# Patient Record
Sex: Male | Born: 1970 | Race: Black or African American | Hispanic: No | Marital: Single | Smoking: Current every day smoker
Health system: Southern US, Community
[De-identification: ages and names within clinical notes are randomized; demographics above are authoritative.]

## PROBLEM LIST (undated history)

## (undated) DIAGNOSIS — I509 Heart failure, unspecified: Secondary | ICD-10-CM

## (undated) DIAGNOSIS — I82A19 Acute embolism and thrombosis of unspecified axillary vein: Secondary | ICD-10-CM

## (undated) HISTORY — PX: LEG SURGERY: SHX1003

---

## 2006-10-23 ENCOUNTER — Emergency Department: Payer: Self-pay | Admitting: Emergency Medicine

## 2007-03-14 ENCOUNTER — Emergency Department: Payer: Self-pay | Admitting: Emergency Medicine

## 2009-09-06 ENCOUNTER — Emergency Department (HOSPITAL_COMMUNITY): Admission: EM | Admit: 2009-09-06 | Discharge: 2009-09-06 | Payer: Self-pay | Admitting: Emergency Medicine

## 2010-01-05 ENCOUNTER — Emergency Department (HOSPITAL_COMMUNITY): Admission: EM | Admit: 2010-01-05 | Discharge: 2010-01-05 | Payer: Self-pay | Admitting: Emergency Medicine

## 2013-09-08 ENCOUNTER — Emergency Department (HOSPITAL_COMMUNITY): Payer: Self-pay

## 2013-09-08 ENCOUNTER — Encounter (HOSPITAL_COMMUNITY): Payer: Self-pay | Admitting: Emergency Medicine

## 2013-09-08 ENCOUNTER — Emergency Department (HOSPITAL_COMMUNITY)
Admission: EM | Admit: 2013-09-08 | Discharge: 2013-09-08 | Disposition: A | Discharge status: Home / Self Care | Payer: Self-pay | Attending: Emergency Medicine | Admitting: Emergency Medicine

## 2013-09-08 DIAGNOSIS — M25519 Pain in unspecified shoulder: Secondary | ICD-10-CM | POA: Insufficient documentation

## 2013-09-08 DIAGNOSIS — M25512 Pain in left shoulder: Secondary | ICD-10-CM

## 2013-09-08 DIAGNOSIS — K644 Residual hemorrhoidal skin tags: Secondary | ICD-10-CM | POA: Insufficient documentation

## 2013-09-08 DIAGNOSIS — Z79899 Other long term (current) drug therapy: Secondary | ICD-10-CM | POA: Insufficient documentation

## 2013-09-08 DIAGNOSIS — F17200 Nicotine dependence, unspecified, uncomplicated: Secondary | ICD-10-CM | POA: Insufficient documentation

## 2013-09-08 DIAGNOSIS — R200 Anesthesia of skin: Secondary | ICD-10-CM

## 2013-09-08 DIAGNOSIS — Z8719 Personal history of other diseases of the digestive system: Secondary | ICD-10-CM | POA: Insufficient documentation

## 2013-09-08 DIAGNOSIS — M47812 Spondylosis without myelopathy or radiculopathy, cervical region: Secondary | ICD-10-CM

## 2013-09-08 DIAGNOSIS — M1990 Unspecified osteoarthritis, unspecified site: Secondary | ICD-10-CM | POA: Insufficient documentation

## 2013-09-08 DIAGNOSIS — R209 Unspecified disturbances of skin sensation: Secondary | ICD-10-CM | POA: Insufficient documentation

## 2013-09-08 DIAGNOSIS — D696 Thrombocytopenia, unspecified: Secondary | ICD-10-CM | POA: Insufficient documentation

## 2013-09-08 LAB — POCT I-STAT, CHEM 8
BUN: 8 mg/dL (ref 6–23)
Chloride: 103 mEq/L (ref 96–112)
Creatinine, Ser: 0.9 mg/dL (ref 0.50–1.35)
Glucose, Bld: 94 mg/dL (ref 70–99)
HCT: 41 % (ref 39.0–52.0)
Hemoglobin: 13.9 g/dL (ref 13.0–17.0)
Potassium: 4 mEq/L (ref 3.5–5.1)
Sodium: 140 mEq/L (ref 135–145)

## 2013-09-08 LAB — CBC WITH DIFFERENTIAL/PLATELET
Hemoglobin: 13 g/dL (ref 13.0–17.0)
Lymphocytes Relative: 53 % — ABNORMAL HIGH (ref 12–46)
Lymphs Abs: 2.2 10*3/uL (ref 0.7–4.0)
MCHC: 33.8 g/dL (ref 30.0–36.0)
Monocytes Relative: 6 % (ref 3–12)
Neutro Abs: 1.6 10*3/uL — ABNORMAL LOW (ref 1.7–7.7)
Neutrophils Relative %: 39 % — ABNORMAL LOW (ref 43–77)
Platelets: 138 10*3/uL — ABNORMAL LOW (ref 150–400)
RBC: 4.8 MIL/uL (ref 4.22–5.81)
WBC: 4.2 10*3/uL (ref 4.0–10.5)

## 2013-09-08 MED ORDER — HYDROMORPHONE HCL PF 1 MG/ML IJ SOLN
1.0000 mg | Freq: Once | INTRAMUSCULAR | Status: AC
Start: 2013-09-08 — End: 2013-09-08
  Administered 2013-09-08: 1 mg via INTRAMUSCULAR
  Filled 2013-09-08: qty 1

## 2013-09-08 MED ORDER — HYDROCODONE-ACETAMINOPHEN 5-325 MG PO TABS: 1.0000 | ORAL_TABLET | ORAL | Status: DC | PRN

## 2013-09-08 MED ORDER — KETOROLAC TROMETHAMINE 60 MG/2ML IM SOLN
60.0000 mg | Freq: Once | INTRAMUSCULAR | Status: AC
  Administered 2013-09-08: 60 mg via INTRAMUSCULAR
  Filled 2013-09-08: qty 2

## 2013-09-08 MED ORDER — NAPROXEN 500 MG PO TABS: 500.0000 mg | ORAL_TABLET | Freq: Two times a day (BID) | ORAL | Status: DC

## 2013-09-08 NOTE — Progress Notes (Signed)
Orthopedic Tech Progress Note Patient Details:  Micheal Maxwell [birth year] [id number]  Ortho Devices Type of Ortho Device: Velcro wrist splint Ortho Device/Splint Location: LUE Ortho Device/Splint Interventions: Ordered;Application   Jennye Moccasin 09/08/2013, 3:38 PM

## 2013-09-08 NOTE — ED Notes (Signed)
No reported injury in left extremity/left arm/shoulder

## 2013-09-08 NOTE — ED Provider Notes (Signed)
CSN: [id number]     Arrival date & time 09/08/13  1047 History   First MD Initiated Contact with Patient 09/08/13 1245     Chief Complaint  Patient presents with  . Arm Pain  . 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 Patient denies any recent trauma or injury. Patient describes pain as aching/burning pain with radiation down arm, and is worse with movement and when he lays on the affected shoulder. Pain improved with tylenol/aspirin products and aleve. Denies weakness, chest pain, dyspnea, and jaw pain.   Patient also mentions 1 yr hx of intermittent, painless, bright red blood per rectum. Admits to hx of hemorrhoids. Denies abdominal pain, diarrhea, and constipation. Denies back pain or any urinary sxs.    History reviewed. No pertinent past medical history. History reviewed. No pertinent past surgical history. History reviewed. No pertinent family history. History  Substance Use Topics  . Smoking status: Current Every Day Smoker  . Smokeless tobacco: Not on file  . Alcohol Use: No    Review of Systems  Constitutional: Negative for fever and chills.  Genitourinary: Negative for dysuria, hematuria, discharge, difficulty urinating, penile pain and testicular pain.  Musculoskeletal: Negative for gait problem, joint swelling, myalgias, neck pain and neck stiffness.  Neurological: Negative for dizziness, weakness and headaches.    Allergies  Review of patient's allergies indicates no known allergies.  Home Medications   Current Outpatient Rx  Name  Route  Sig  Dispense  Refill  . Aspirin-Caffeine (BAYER BACK & BODY PAIN EX ST) 500-32.5 MG TABS   Oral   Take 2 tablets by mouth every 4 (four) hours as needed (for pain).         . Aspirin-Salicylamide-Caffeine (BC HEADACHE POWDER PO)   Oral   Take  1 Package by mouth daily as needed (for pain).         Marland Kitchen ibuprofen (ADVIL,MOTRIN) 200 MG tablet   Oral   Take 800 mg by mouth every 6 (six) hours as needed for moderate pain.         Marland Kitchen HYDROcodone-acetaminophen (NORCO/VICODIN) 5-325 MG per tablet   Oral   Take 1 tablet by mouth every 4 (four) hours as needed.   15 tablet   0   . naproxen (NAPROSYN) 500 MG tablet   Oral   Take 1 tablet (500 mg total) by mouth 2 (two) times daily.   30 tablet   0    BP 117/87  Pulse 59  Temp(Src) 97.8 F (36.6 C) (Oral)  Resp 18  SpO2 93% Physical Exam  Nursing note and vitals reviewed. Constitutional: He is oriented to person, place, and time. He appears well-developed and well-nourished. No distress.  HENT:  Head: Normocephalic and atraumatic.  Neck: Normal range of motion. Neck supple.  Cardiovascular: Normal rate, regular rhythm, normal heart sounds and intact distal pulses.  Exam reveals no gallop and no friction rub.   No murmur heard. Pulses:      Radial pulses are 2+ on the right side, and 2+ on the left side.       Dorsalis pedis pulses are 2+ on the right side, and 2+ on the left side.  Pulmonary/Chest: Effort normal and breath sounds normal. No respiratory distress. He has no wheezes. He has no rales.  Abdominal: Soft. Bowel sounds are normal. There is no tenderness.  Genitourinary: Penis normal. Rectal  exam shows external hemorrhoid (Old external hemorrhoid. No signs of thrombosis.). Rectal exam shows no fissure, no mass, no tenderness and anal tone normal. Guaiac negative stool.    Left testis shows no mass, no swelling and no tenderness. Circumcised. No penile erythema or penile tenderness. No discharge found.  Musculoskeletal: Normal range of motion. He exhibits no edema.       Left shoulder: He exhibits pain (pain with ROM). He exhibits normal range of motion, no bony tenderness, no swelling, no effusion, no crepitus, no deformity and normal strength.       Left wrist:  Normal.       Arms:      Left hand: He exhibits normal range of motion, no tenderness, normal capillary refill and no swelling. Decreased sensation noted. Decreased sensation is present in the medial distribution. Decreased strength noted.  No thenar atrophy of LEFT hand.   Neurological: He is alert and oriented to person, place, and time. He has normal strength. He displays no atrophy.  Reflex Scores:      Bicep reflexes are 2+ on the right side and 2+ on the left side. Skin: Skin is warm and dry. He is not diaphoretic.  Psychiatric: He has a normal mood and affect. His behavior is normal.    ED Course  Procedures (including critical care time) Labs Review Labs Reviewed  CBC WITH DIFFERENTIAL - Abnormal; Notable for the following:    HCT 38.5 (*)    RDW 15.9 (*)    Platelets 138 (*)    Neutrophils Relative % 39 (*)    Neutro Abs 1.6 (*)    Lymphocytes Relative 53 (*)    All other components within normal limits  POCT I-STAT, CHEM 8 - Abnormal; Notable for the following:    Calcium, Ion 1.32 (*)    All other components within normal limits  OCCULT BLOOD X 1 CARD TO LAB, STOOL  OCCULT BLOOD, POC DEVICE   Imaging Review Dg Cervical Spine Complete  09/08/2013   CLINICAL DATA:  Neck pain  EXAM: CERVICAL SPINE  4+ VIEWS  COMPARISON:  None.  FINDINGS: There is no evidence of cervical spine fracture or prevertebral soft tissue swelling. Alignment is normal. Disc space narrowing and hypertrophic spurring is identified at the C5-6 and C6-7 levels.  IMPRESSION: No evidence of acute osseous abnormalities. Degenerative disc disease changes at C5-6 and C6-7.   Electronically Signed   By: Salome Holmes M.D.   On: 09/08/2013 15:31   Dg Shoulder Left  09/08/2013   CLINICAL DATA:  Left shoulder pain  EXAM: LEFT SHOULDER - 2+ VIEW  COMPARISON:  None.  FINDINGS: There is no evidence of fracture or dislocation. There is no evidence of arthropathy or other focal bone abnormality. Soft tissues are  unremarkable.  IMPRESSION: Negative.   Electronically Signed   By: Ruel Favors M.D.   On: 09/08/2013 15:31    EKG Interpretation    Date/Time:  Tuesday September 08 2013 10:50:19 EST Ventricular Rate:  81 PR Interval:  158 QRS Duration: 84 QT Interval:  340 QTC Calculation: 394 R Axis:   39 Text Interpretation:  Normal sinus rhythm Minimal voltage criteria for LVH, may be normal variant Early repolarization Borderline ECG No old tracing to compare Confirmed by GOLDSTON  MD, SCOTT ([id number]) on 09/08/2013 1:23:07 PM            MDM   1. Shoulder pain, left   2. Numbness and tingling in left hand  3. History of rectal bleeding   4. Cervical spine degeneration     Mild incidental thrombocytopenia. Hgb WNL. Stool guiac negative. Cervical plain film shows disc space narrowing with hypertrophic spurring at C5-C6 and C6-C7, consistent with Degenerative disc disease changes. No acute abnormalities noted. Plain film LEFT shoulder negative.  EKG unchanged compared to prior study.   Discussed lab and exam findings with patient. Patient advised to follow up with PCP in 2 days. Patient given resource guide for follow up in addition to [facility] clinic follow up information. Patient's pain improved with treatment in ED. Educated patient on discharge medications, advised avoidance of driving and tylenol containing products with Vicodin. Patient agrees with plan. Discharged in  Good condition.   Meds given in ED:  Medications  ketorolac (TORADOL) injection 60 mg (60 mg Intramuscular Given 09/08/13 1408)  HYDROmorphone (DILAUDID) injection 1 mg (1 mg Intramuscular Given 09/08/13 1409)    Discharge Medication List as of 09/08/2013  3:44 PM    START taking these medications   Details  HYDROcodone-acetaminophen (NORCO/VICODIN) 5-325 MG per tablet Take 1 tablet by mouth every 4 (four) hours as needed., Starting 09/08/2013, Until Discontinued, Print    naproxen (NAPROSYN) 500 MG tablet  Take 1 tablet (500 mg total) by mouth 2 (two) times daily., Starting 09/08/2013, Until Discontinued, Print               Rudene Anda, [place] 09/08/13 1630

## 2013-09-08 NOTE — ED Notes (Signed)
Ortho called and responded to place carpal tunnel splint on left wrist

## 2013-09-08 NOTE — ED Provider Notes (Signed)
Medical screening examination/treatment/procedure(s) were conducted as a shared visit with non-physician practitioner(s) and myself.  I personally evaluated the patient during the encounter.  EKG Interpretation    Date/Time:  Tuesday September 08 2013 10:50:19 EST Ventricular Rate:  81 PR Interval:  158 QRS Duration: 84 QT Interval:  340 QTC Calculation: 394 R Axis:   39 Text Interpretation:  Normal sinus rhythm Minimal voltage criteria for LVH, may be normal variant Early repolarization Borderline ECG No old tracing to compare Confirmed by Paytin Ramakrishnan  MD, Amier Hoyt ([id number]) on 09/08/2013 1:23:07 PM            Patient with signs of median nerve compression. Good strength in both upper extremities. Normal pulses. Atraumatic pain but seems MSK with movement pain. Will treat symptomatically. Benign Xrays. Given his persistent carpal tunnel sx will refer to plastics.   Audree Camel, MD 09/08/13 [phone]

## 2013-09-08 NOTE — ED Notes (Signed)
Pt c/o left shoulder blade pain with radiation to left arm with numbness and tingling x 2 weeks; pt sts some bleeding from hemorrhoids when wiping increasing over last couple of days

## 2016-09-25 ENCOUNTER — Encounter (HOSPITAL_COMMUNITY): Payer: Self-pay | Admitting: Emergency Medicine

## 2016-09-25 ENCOUNTER — Emergency Department (HOSPITAL_COMMUNITY)
Admission: EM | Admit: 2016-09-25 | Discharge: 2016-09-25 | Disposition: A | Discharge status: Home / Self Care | Payer: Self-pay | Attending: Emergency Medicine | Admitting: Emergency Medicine

## 2016-09-25 DIAGNOSIS — J019 Acute sinusitis, unspecified: Secondary | ICD-10-CM | POA: Insufficient documentation

## 2016-09-25 DIAGNOSIS — Z7982 Long term (current) use of aspirin: Secondary | ICD-10-CM | POA: Insufficient documentation

## 2016-09-25 DIAGNOSIS — F17200 Nicotine dependence, unspecified, uncomplicated: Secondary | ICD-10-CM | POA: Insufficient documentation

## 2016-09-25 MED ORDER — PSEUDOEPHEDRINE HCL 30 MG PO TABS
30.0000 mg | ORAL_TABLET | Freq: Once | ORAL | Status: AC
Start: 2016-09-25 — End: 2016-09-25
  Administered 2016-09-25: 30 mg via ORAL
  Filled 2016-09-25: qty 1

## 2016-09-25 MED ORDER — AZITHROMYCIN 250 MG PO TABS
500.0000 mg | ORAL_TABLET | Freq: Once | ORAL | Status: AC
  Administered 2016-09-25: 500 mg via ORAL
  Filled 2016-09-25: qty 2

## 2016-09-25 MED ORDER — PSEUDOEPHEDRINE HCL ER 120 MG PO TB12: 120.0000 mg | ORAL_TABLET | Freq: Two times a day (BID) | ORAL | 0 refills | Status: DC

## 2016-09-25 MED ORDER — AZITHROMYCIN 250 MG PO TABS: 250.0000 mg | ORAL_TABLET | Freq: Every day | ORAL | 0 refills | Status: AC

## 2016-09-25 NOTE — ED Triage Notes (Signed)
Pt sts URI sx with productive cough

## 2016-09-25 NOTE — ED Provider Notes (Signed)
[facility] DEPT Provider Note   CSN: [id number] Arrival date & time: 09/25/16  1505     History   Chief Complaint Chief Complaint  Patient presents with  . URI    HPI Micheal Maxwell is a 46 y.o. male.  HPI Patient presents with concern of ongoing nasal drainage, malodorous is charge from his oropharynx. Patient became ill about 10 days ago, initially with sore throat, chills, fever, cough. After several days of TheraFlu, sore throat is resolved, and he has no ongoing cough, fever. However, he has copious discharge, malodorous, persistent in spite of using additional OTC medication. He was well prior to the onset of symptoms. Patient smokes, states that he is otherwise healthy.  History reviewed. No pertinent past medical history.  There are no active problems to display for this patient.   History reviewed. No pertinent surgical history.     Home Medications    Prior to Admission medications   Medication Sig Start Date End Date Taking? Authorizing Provider  Aspirin-Caffeine (BAYER BACK & BODY PAIN EX ST) 500-32.5 MG TABS Take 2 tablets by mouth every 4 (four) hours as needed (for pain).    Historical Provider, MD  Aspirin-Salicylamide-Caffeine (BC HEADACHE POWDER PO) Take 1 Package by mouth daily as needed (for pain).    Historical Provider, MD  azithromycin (ZITHROMAX) 250 MG tablet Take 1 tablet (250 mg total) by mouth daily. Take 1 every day until finished. 09/26/16 09/30/16  Carmin Muskrat, MD  HYDROcodone-acetaminophen (NORCO/VICODIN) 5-325 MG per tablet Take 1 tablet by mouth every 4 (four) hours as needed. 09/08/13   Maude Leriche, PA-C  ibuprofen (ADVIL,MOTRIN) 200 MG tablet Take 800 mg by mouth every 6 (six) hours as needed for moderate pain.    Historical Provider, MD  naproxen (NAPROSYN) 500 MG tablet Take 1 tablet (500 mg total) by mouth 2 (two) times daily. 09/08/13   Maude Leriche, PA-C  pseudoephedrine (SUDAFED 12 HOUR) 120 MG 12 hr tablet Take 1 tablet  (120 mg total) by mouth 2 (two) times daily. Use twice daily for five days 09/25/16   Carmin Muskrat, MD    Family History History reviewed. No pertinent family history.  Social History Social History  Substance Use Topics  . Smoking status: Current Every Day Smoker  . Smokeless tobacco: Not on file  . Alcohol use No     Allergies   Patient has no known allergies.   Review of Systems Review of Systems  Constitutional:       Per HPI, otherwise negative  HENT:       Per HPI, otherwise negative  Respiratory:       Per HPI, otherwise negative  Cardiovascular:       Per HPI, otherwise negative  Gastrointestinal: Negative for vomiting.  Endocrine:       Negative aside from HPI  Genitourinary:       Neg aside from HPI   Musculoskeletal:       Per HPI, otherwise negative  Skin: Negative.   Neurological: Negative for syncope.     Physical Exam Updated Vital Signs BP 127/88 (BP Location: Right Arm)   Pulse 74   Temp 98.3 F (36.8 C) (Oral)   Resp 18   Ht 5\' 6"  (1.676 m)   Wt 171 lb (77.6 kg)   SpO2 100%   BMI 27.60 kg/m   Physical Exam  Constitutional: He is oriented to person, place, and time. He appears well-developed. No distress.  HENT:  Head: Normocephalic and  atraumatic.  Right Ear: Tympanic membrane normal.  Left Ear: Tympanic membrane normal.  Mouth/Throat: Mucous membranes are not pale. No tonsillar abscesses.  Eyes: Conjunctivae and EOM are normal.  Cardiovascular: Normal rate and regular rhythm.   Pulmonary/Chest: Effort normal. No stridor. No respiratory distress.  Abdominal: He exhibits no distension.  Musculoskeletal: He exhibits no edema.  Neurological: He is alert and oriented to person, place, and time.  Skin: Skin is warm and dry.  Psychiatric: He has a normal mood and affect.  Nursing note and vitals reviewed.    ED Treatments / Results   Radiology No results found.  Procedures Procedures (including critical care  time)  Medications Ordered in ED Medications  azithromycin (ZITHROMAX) tablet 500 mg (not administered)  pseudoephedrine (SUDAFED) tablet 30 mg (not administered)     Initial Impression / Assessment and Plan / ED Course  I have reviewed the triage vital signs and the nursing notes.  Pertinent labs & imaging results that were available during my care of the patient were reviewed by me and considered in my medical decision making (see chart for details).  Clinical Course     Patient presents with almost 10 days of ongoing URI like illness with ongoing persistent malodorous discharge. Given the patient's smoking history, concern for persistent symptoms for greater than one week, there suspicion for acute sinusitis. No evidence for bacteremia, sepsis, or pharyngeal abscess.  Patient started on a course of medication, antibiotics, Sudafed, discharged with outpatient follow-up.  Final Clinical Impressions(s) / ED Diagnoses   Final diagnoses:  Acute non-recurrent sinusitis, unspecified location    New Prescriptions New Prescriptions   AZITHROMYCIN (ZITHROMAX) 250 MG TABLET    Take 1 tablet (250 mg total) by mouth daily. Take 1 every day until finished.   PSEUDOEPHEDRINE (SUDAFED 12 HOUR) 120 MG 12 HR TABLET    Take 1 tablet (120 mg total) by mouth 2 (two) times daily. Use twice daily for five days     Carmin Muskrat, MD 09/25/16 [phone]

## 2017-05-05 ENCOUNTER — Encounter: Payer: Self-pay | Admitting: Emergency Medicine

## 2017-05-05 DIAGNOSIS — F17200 Nicotine dependence, unspecified, uncomplicated: Secondary | ICD-10-CM | POA: Insufficient documentation

## 2017-05-05 DIAGNOSIS — R310 Gross hematuria: Secondary | ICD-10-CM | POA: Insufficient documentation

## 2017-05-05 DIAGNOSIS — Z791 Long term (current) use of non-steroidal anti-inflammatories (NSAID): Secondary | ICD-10-CM | POA: Insufficient documentation

## 2017-05-05 LAB — CBC
HEMATOCRIT: 41.1 % (ref 40.0–52.0)
Hemoglobin: 13.4 g/dL (ref 13.0–18.0)
MCH: 26.3 pg (ref 26.0–34.0)
MCHC: 32.5 g/dL (ref 32.0–36.0)
MCV: 81 fL (ref 80.0–100.0)
Platelets: 220 10*3/uL (ref 150–440)
RBC: 5.08 MIL/uL (ref 4.40–5.90)
RDW: 16.6 % — ABNORMAL HIGH (ref 11.5–14.5)
WBC: 6.4 10*3/uL (ref 3.8–10.6)

## 2017-05-05 LAB — URINALYSIS, COMPLETE (UACMP) WITH MICROSCOPIC
BACTERIA UA: NONE SEEN
Bilirubin Urine: NEGATIVE
GLUCOSE, UA: NEGATIVE mg/dL
KETONES UR: NEGATIVE mg/dL
Leukocytes, UA: NEGATIVE
Nitrite: NEGATIVE
Protein, ur: NEGATIVE mg/dL
Specific Gravity, Urine: 1.028 (ref 1.005–1.030)
pH: 5 (ref 5.0–8.0)

## 2017-05-05 LAB — COMPREHENSIVE METABOLIC PANEL
ALBUMIN: 4 g/dL (ref 3.5–5.0)
ALT: 19 U/L (ref 17–63)
AST: 22 U/L (ref 15–41)
Alkaline Phosphatase: 77 U/L (ref 38–126)
Anion gap: 6 (ref 5–15)
BUN: 13 mg/dL (ref 6–20)
CHLORIDE: 106 mmol/L (ref 101–111)
CO2: 28 mmol/L (ref 22–32)
Calcium: 9.4 mg/dL (ref 8.9–10.3)
Creatinine, Ser: 1.29 mg/dL — ABNORMAL HIGH (ref 0.61–1.24)
GFR calc Af Amer: >60 mL/min (ref >60)
GFR calc non Af Amer: >60 mL/min (ref >60)
GLUCOSE: 89 mg/dL (ref 65–99)
POTASSIUM: 4.6 mmol/L (ref 3.5–5.1)
Sodium: 140 mmol/L (ref 135–145)
Total Bilirubin: 0.6 mg/dL (ref 0.3–1.2)
Total Protein: 6.7 g/dL (ref 6.5–8.1)

## 2017-05-05 NOTE — ED Triage Notes (Signed)
Patient with complaint of bright red blood and clots in his urine times three days. Patient states that he has also had some bleeding with his stool for over a year. Patient states that he has a history of hemorrhoids.

## 2017-05-06 ENCOUNTER — Encounter: Payer: Self-pay | Admitting: Radiology

## 2017-05-06 ENCOUNTER — Emergency Department: Payer: Self-pay

## 2017-05-06 ENCOUNTER — Emergency Department
Admission: EM | Admit: 2017-05-06 | Discharge: 2017-05-06 | Disposition: A | Discharge status: Home / Self Care | Payer: Self-pay | Attending: Emergency Medicine | Admitting: Emergency Medicine

## 2017-05-06 DIAGNOSIS — R310 Gross hematuria: Secondary | ICD-10-CM

## 2017-05-06 LAB — CHLAMYDIA/NGC RT PCR (ARMC ONLY)
Chlamydia Tr: NOT DETECTED
N gonorrhoeae: NOT DETECTED

## 2017-05-06 MED ORDER — IOPAMIDOL (ISOVUE-300) INJECTION 61%
100.0000 mL | Freq: Once | INTRAVENOUS | Status: AC | PRN
  Administered 2017-05-06: 100 mL via INTRAVENOUS

## 2017-05-06 NOTE — ED Notes (Signed)
Patient verbalizes understanding of discharge instructions home care and follow up care. Patient out of department at this time.
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
## 2017-05-13 ENCOUNTER — Ambulatory Visit: Payer: Self-pay
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
## 2017-05-16 ENCOUNTER — Ambulatory Visit (INDEPENDENT_AMBULATORY_CARE_PROVIDER_SITE_OTHER): Payer: Self-pay | Admitting: Urology

## 2017-05-16 ENCOUNTER — Encounter: Payer: Self-pay | Admitting: Urology

## 2017-05-16 VITALS — BP 130/88 | HR 65 | Ht 66.0 in | Wt 166.2 lb

## 2017-05-16 DIAGNOSIS — R310 Gross hematuria: Secondary | ICD-10-CM

## 2017-05-16 DIAGNOSIS — N400 Enlarged prostate without lower urinary tract symptoms: Secondary | ICD-10-CM

## 2017-05-16 LAB — MICROSCOPIC EXAMINATION
Bacteria, UA: NONE SEEN
EPITHELIAL CELLS (NON RENAL): NONE SEEN /HPF (ref 0–10)
WBC, UA: NONE SEEN /hpf (ref 0–?)

## 2017-05-16 LAB — URINALYSIS, COMPLETE
Bilirubin, UA: NEGATIVE
GLUCOSE, UA: NEGATIVE
KETONES UA: NEGATIVE
LEUKOCYTES UA: NEGATIVE
NITRITE UA: NEGATIVE
PROTEIN UA: NEGATIVE
RBC, UA: NEGATIVE
Urobilinogen, Ur: 1 mg/dL (ref 0.2–1.0)
pH, UA: 6 (ref 5.0–7.5)

## 2017-05-17 ENCOUNTER — Telehealth: Payer: Self-pay | Admitting: Family Medicine

## 2017-05-17 LAB — BASIC METABOLIC PANEL
BUN/Creatinine Ratio: 16 (ref 9–20)
BUN: 15 mg/dL (ref 6–24)
CHLORIDE: 104 mmol/L (ref 96–106)
CO2: 21 mmol/L (ref 20–29)
Calcium: 9.7 mg/dL (ref 8.7–10.2)
Creatinine, Ser: 0.94 mg/dL (ref 0.76–1.27)
GFR calc Af Amer: 110 mL/min/{1.73_m2} (ref >59)
GFR, EST NON AFRICAN AMERICAN: 95 mL/min/{1.73_m2} (ref >59)
Glucose: 91 mg/dL (ref 65–99)
POTASSIUM: 4.2 mmol/L (ref 3.5–5.2)
SODIUM: 141 mmol/L (ref 134–144)

## 2017-05-17 LAB — CBC WITH DIFFERENTIAL/PLATELET
BASOS ABS: 0 10*3/uL (ref 0.0–0.2)
Basos: 1 %
EOS (ABSOLUTE): 0.2 10*3/uL (ref 0.0–0.4)
Eos: 5 %
HEMOGLOBIN: 12.4 g/dL — AB (ref 13.0–17.7)
Hematocrit: 37.9 % (ref 37.5–51.0)
IMMATURE GRANS (ABS): 0 10*3/uL (ref 0.0–0.1)
Immature Granulocytes: 0 %
LYMPHS: 52 %
Lymphocytes Absolute: 2.4 10*3/uL (ref 0.7–3.1)
MCH: 26.2 pg — ABNORMAL LOW (ref 26.6–33.0)
MCHC: 32.7 g/dL (ref 31.5–35.7)
MCV: 80 fL (ref 79–97)
MONOCYTES: 7 %
Monocytes Absolute: 0.3 10*3/uL (ref 0.1–0.9)
Neutrophils Absolute: 1.6 10*3/uL (ref 1.4–7.0)
Neutrophils: 35 %
Platelets: 225 10*3/uL (ref 150–379)
RBC: 4.73 x10E6/uL (ref 4.14–5.80)
RDW: 16.5 % — ABNORMAL HIGH (ref 12.3–15.4)
WBC: 4.5 10*3/uL (ref 3.4–10.8)

## 2017-05-17 LAB — PSA: Prostate Specific Ag, Serum: 1.8 ng/mL (ref 0.0–4.0)

## 2017-05-17 NOTE — Telephone Encounter (Signed)
-----   Message from Nori Riis, PA-C sent at 05/17/2017  7:37 AM EDT ----- Please let Glendell Docker know that his PSA is normal, but his HBG is a little low.  I would suggest eating a bowl of fortified cereal daily, like Total, and wait on the results of the CT and cystoscopy before taking further action.

## 2017-05-17 NOTE — Telephone Encounter (Signed)
Patient notified and voiced understanding.

## 2017-05-21 LAB — CULTURE, URINE COMPREHENSIVE

## 2017-05-30 ENCOUNTER — Ambulatory Visit
Admission: RE | Admit: 2017-05-30 | Discharge: 2017-05-30 | Disposition: A | Discharge status: Home / Self Care | Payer: Self-pay | Source: Ambulatory Visit | Attending: Urology | Admitting: Urology

## 2017-05-30 DIAGNOSIS — I700 Atherosclerosis of aorta: Secondary | ICD-10-CM | POA: Insufficient documentation

## 2017-05-30 DIAGNOSIS — D1803 Hemangioma of intra-abdominal structures: Secondary | ICD-10-CM | POA: Insufficient documentation

## 2017-05-30 DIAGNOSIS — R310 Gross hematuria: Secondary | ICD-10-CM | POA: Insufficient documentation

## 2017-05-30 DIAGNOSIS — Q630 Accessory kidney: Secondary | ICD-10-CM | POA: Insufficient documentation

## 2017-05-30 DIAGNOSIS — K8020 Calculus of gallbladder without cholecystitis without obstruction: Secondary | ICD-10-CM | POA: Insufficient documentation

## 2017-05-30 MED ORDER — IOPAMIDOL (ISOVUE-300) INJECTION 61%
125.0000 mL | Freq: Once | INTRAVENOUS | Status: AC | PRN
  Administered 2017-05-30: 125 mL via INTRAVENOUS

## 2017-05-31 ENCOUNTER — Telehealth: Payer: Self-pay

## 2017-05-31 ENCOUNTER — Other Ambulatory Visit: Payer: Self-pay | Admitting: Urology

## 2017-05-31 NOTE — Telephone Encounter (Signed)
The CT scan is the first part of the work up for blood in the urine.  Results are really not final until the cystoscopy is completed.  We can try to move his cystoscopy up and he can open his My Chart account and see the CT scan report.

## 2017-05-31 NOTE — Telephone Encounter (Signed)
Spoke with patient and gave him message. Patient would like to move the cytoscopy up. Angie booked him with Dr. Junious Silk on September 24th at 3:15pm. Patient ok with plan.

## 2017-05-31 NOTE — Telephone Encounter (Signed)
Pt called requesting CT results. Please advise.  Pt does have a cysto appt 10/5 with Dr. Erlene Quan.

## 2017-06-10 ENCOUNTER — Other Ambulatory Visit: Payer: Self-pay | Admitting: Urology

## 2017-06-12 ENCOUNTER — Other Ambulatory Visit: Payer: Self-pay | Admitting: Urology

## 2017-06-18 ENCOUNTER — Ambulatory Visit (INDEPENDENT_AMBULATORY_CARE_PROVIDER_SITE_OTHER): Payer: Self-pay | Admitting: Urology

## 2017-06-18 ENCOUNTER — Encounter: Payer: Self-pay | Admitting: Urology

## 2017-06-18 VITALS — BP 123/82 | HR 61 | Ht 66.0 in | Wt 173.0 lb

## 2017-06-18 DIAGNOSIS — R310 Gross hematuria: Secondary | ICD-10-CM

## 2017-06-18 MED ORDER — LIDOCAINE HCL 2 % EX GEL
1.0000 "application " | Freq: Once | CUTANEOUS | Status: AC
  Administered 2017-06-18: 1 via URETHRAL

## 2017-06-18 MED ORDER — CIPROFLOXACIN HCL 500 MG PO TABS
500.0000 mg | ORAL_TABLET | Freq: Once | ORAL | Status: AC
  Administered 2017-06-18: 500 mg via ORAL

## 2017-06-18 NOTE — Progress Notes (Signed)
   06/18/17  CC:  Chief Complaint  Patient presents with  . Cysto    HPI:  Micheal Maxwell follows up for a gross hematuria evaluation. He underwent a CT scan of the abdomen and pelvis 05/30/2017 which was benign. 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 I reviewed all the images. He had moderate prostate enlargement on the CT and recent PSA of 1.8. He also complained about erectile dysfunction and he'll follow up to discuss this further.  There were no vitals taken for this visit. NED. A&Ox3.   No respiratory distress   Abd soft, NT, ND Normal phallus with bilateral descended testicles  Cystoscopy Procedure Note  Patient identification was confirmed, informed consent was obtained, and patient was prepped using Betadine solution.  Lidocaine jelly was administered per urethral meatus.    Preoperative abx where received prior to procedure.     Pre-Procedure: - Inspection reveals a normal caliber ureteral meatus.  Procedure: The flexible cystoscope was introduced without difficulty - No urethral strictures/lesions are present. - prostate slight elongated  - elevated bladder neck - Bilateral ureteral orifices identified - Bladder mucosa  reveals no ulcers, tumors, or lesions - No bladder stones - No trabeculation  Retroflexion shows normal bladder neck    Post-Procedure: - Patient tolerated the procedure well  Assessment/ Plan:  Gross hematuria-benign evaluation. We did discussed the link between smoking and GU malignancies and I encouraged him to stop.  Erectile dysfunction-he'll follow-up to discuss.

## 2017-06-21 ENCOUNTER — Other Ambulatory Visit: Payer: Self-pay | Admitting: Urology
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
## 2017-07-09 ENCOUNTER — Ambulatory Visit: Payer: Self-pay | Admitting: Urology
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
## 2017-07-11 ENCOUNTER — Ambulatory Visit (INDEPENDENT_AMBULATORY_CARE_PROVIDER_SITE_OTHER): Payer: Self-pay | Admitting: Urology

## 2017-07-11 ENCOUNTER — Encounter: Payer: Self-pay | Admitting: Urology

## 2017-07-11 VITALS — BP 143/94 | HR 79 | Ht 66.0 in | Wt 176.0 lb

## 2017-07-11 DIAGNOSIS — N528 Other male erectile dysfunction: Secondary | ICD-10-CM

## 2017-07-11 DIAGNOSIS — N401 Enlarged prostate with lower urinary tract symptoms: Secondary | ICD-10-CM

## 2017-07-11 DIAGNOSIS — N138 Other obstructive and reflux uropathy: Secondary | ICD-10-CM

## 2017-07-11 DIAGNOSIS — Z87448 Personal history of other diseases of urinary system: Secondary | ICD-10-CM

## 2017-07-11 MED ORDER — SILDENAFIL CITRATE 20 MG PO TABS: ORAL_TABLET | ORAL | 3 refills | Status: DC

## 2017-07-14 LAB — TSH: TSH: 1.39 u[IU]/mL (ref 0.450–4.500)

## 2017-07-14 LAB — TESTOSTERONE: TESTOSTERONE: 399 ng/dL (ref 264–916)

## 2017-07-15 ENCOUNTER — Telehealth: Payer: Self-pay

## 2017-07-15 NOTE — Telephone Encounter (Signed)
-----   Message from Nori Riis, PA-C sent at 07/15/2017 11:26 AM EDT ----- Please let Cark know that his testosterone level is normal and that his thyroid is normal as well.   We will see him in one month.

## 2017-07-15 NOTE — Telephone Encounter (Signed)
Spoke with pt in reference to lab results. Pt voiced understanding.  
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
## 2017-08-12 ENCOUNTER — Ambulatory Visit: Payer: Self-pay | Admitting: Urology

## 2017-08-12 ENCOUNTER — Encounter: Payer: Self-pay | Admitting: Urology
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
## 2017-08-19 ENCOUNTER — Encounter: Payer: Self-pay | Admitting: Urology

## 2017-08-19 ENCOUNTER — Ambulatory Visit: Payer: Self-pay | Admitting: Urology

## 2017-12-24 ENCOUNTER — Encounter: Payer: Self-pay | Admitting: Emergency Medicine

## 2017-12-24 ENCOUNTER — Other Ambulatory Visit: Payer: Self-pay

## 2017-12-24 ENCOUNTER — Emergency Department: Payer: Self-pay

## 2017-12-24 ENCOUNTER — Emergency Department
Admission: EM | Admit: 2017-12-24 | Discharge: 2017-12-24 | Disposition: A | Discharge status: Home / Self Care | Payer: Self-pay | Attending: Student in an Organized Health Care Education/Training Program | Admitting: Student in an Organized Health Care Education/Training Program

## 2017-12-24 DIAGNOSIS — R197 Diarrhea, unspecified: Secondary | ICD-10-CM | POA: Insufficient documentation

## 2017-12-24 DIAGNOSIS — F17200 Nicotine dependence, unspecified, uncomplicated: Secondary | ICD-10-CM | POA: Insufficient documentation

## 2017-12-24 DIAGNOSIS — R112 Nausea with vomiting, unspecified: Secondary | ICD-10-CM | POA: Insufficient documentation

## 2017-12-24 LAB — URINALYSIS, COMPLETE (UACMP) WITH MICROSCOPIC
Bacteria, UA: NONE SEEN
Bilirubin Urine: NEGATIVE
Glucose, UA: NEGATIVE mg/dL
HGB URINE DIPSTICK: NEGATIVE
KETONES UR: NEGATIVE mg/dL
LEUKOCYTES UA: NEGATIVE
Nitrite: NEGATIVE
PH: 5 (ref 5.0–8.0)
Protein, ur: NEGATIVE mg/dL
Specific Gravity, Urine: 1.028 (ref 1.005–1.030)

## 2017-12-24 LAB — COMPREHENSIVE METABOLIC PANEL
ALK PHOS: 68 U/L (ref 38–126)
ALT: 17 U/L (ref 17–63)
ANION GAP: 2 — AB (ref 5–15)
AST: 17 U/L (ref 15–41)
Albumin: 4 g/dL (ref 3.5–5.0)
BUN: 12 mg/dL (ref 6–20)
CALCIUM: 8.7 mg/dL — AB (ref 8.9–10.3)
CO2: 26 mmol/L (ref 22–32)
CREATININE: 1 mg/dL (ref 0.61–1.24)
Chloride: 109 mmol/L (ref 101–111)
Glucose, Bld: 103 mg/dL — ABNORMAL HIGH (ref 65–99)
Potassium: 4.1 mmol/L (ref 3.5–5.1)
Sodium: 137 mmol/L (ref 135–145)
TOTAL PROTEIN: 6.8 g/dL (ref 6.5–8.1)
Total Bilirubin: 0.5 mg/dL (ref 0.3–1.2)

## 2017-12-24 LAB — CBC
HCT: 39.7 % — ABNORMAL LOW (ref 40.0–52.0)
HEMOGLOBIN: 12.8 g/dL — AB (ref 13.0–18.0)
MCH: 25.6 pg — AB (ref 26.0–34.0)
MCHC: 32.2 g/dL (ref 32.0–36.0)
MCV: 79.6 fL — ABNORMAL LOW (ref 80.0–100.0)
PLATELETS: 199 10*3/uL (ref 150–440)
RBC: 4.99 MIL/uL (ref 4.40–5.90)
RDW: 17.2 % — ABNORMAL HIGH (ref 11.5–14.5)
WBC: 3.9 10*3/uL (ref 3.8–10.6)

## 2017-12-24 LAB — LIPASE, BLOOD: Lipase: 38 U/L (ref 11–51)

## 2017-12-24 MED ORDER — PROMETHAZINE HCL 12.5 MG PO TABS: 12.5000 mg | ORAL_TABLET | Freq: Four times a day (QID) | ORAL | 0 refills | Status: DC | PRN

## 2017-12-24 MED ORDER — AZITHROMYCIN 500 MG PO TABS: 500.0000 mg | ORAL_TABLET | Freq: Every day | ORAL | 0 refills | Status: AC

## 2017-12-24 NOTE — ED Triage Notes (Signed)
Pt to ED via POV with c/o nausea, vomiting, and diarrhea x4days. Pt denies fever. VSS
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
## 2017-12-24 NOTE — Discharge Instructions (Addendum)

## 2018-11-04 DIAGNOSIS — Z720 Tobacco use: Secondary | ICD-10-CM | POA: Insufficient documentation

## 2018-12-03 DIAGNOSIS — R7303 Prediabetes: Secondary | ICD-10-CM | POA: Insufficient documentation

## 2018-12-03 DIAGNOSIS — E538 Deficiency of other specified B group vitamins: Secondary | ICD-10-CM | POA: Insufficient documentation

## 2019-03-19 DIAGNOSIS — F529 Unspecified sexual dysfunction not due to a substance or known physiological condition: Secondary | ICD-10-CM | POA: Insufficient documentation

## 2019-03-19 DIAGNOSIS — R1013 Epigastric pain: Secondary | ICD-10-CM | POA: Insufficient documentation

## 2019-03-19 DIAGNOSIS — R0789 Other chest pain: Secondary | ICD-10-CM | POA: Insufficient documentation

## 2019-03-19 DIAGNOSIS — K5909 Other constipation: Secondary | ICD-10-CM | POA: Insufficient documentation

## 2019-07-23 ENCOUNTER — Encounter: Payer: Self-pay | Admitting: Emergency Medicine

## 2019-07-23 ENCOUNTER — Ambulatory Visit
Admission: EM | Admit: 2019-07-23 | Discharge: 2019-07-23 | Disposition: A | Discharge status: Home / Self Care | Payer: PRIVATE HEALTH INSURANCE | Attending: Urgent Care | Admitting: Urgent Care

## 2019-07-23 ENCOUNTER — Other Ambulatory Visit: Payer: Self-pay

## 2019-07-23 DIAGNOSIS — X500XXA Overexertion from strenuous movement or load, initial encounter: Secondary | ICD-10-CM | POA: Diagnosis not present

## 2019-07-23 DIAGNOSIS — M62830 Muscle spasm of back: Secondary | ICD-10-CM | POA: Diagnosis not present

## 2019-07-23 DIAGNOSIS — T148XXA Other injury of unspecified body region, initial encounter: Secondary | ICD-10-CM

## 2019-07-23 MED ORDER — METHOCARBAMOL 500 MG PO TABS: 500.0000 mg | ORAL_TABLET | Freq: Two times a day (BID) | ORAL | 0 refills | Status: DC

## 2019-07-23 MED ORDER — NAPROXEN 500 MG PO TABS: 500.0000 mg | ORAL_TABLET | Freq: Two times a day (BID) | ORAL | 0 refills | Status: DC

## 2019-07-23 NOTE — ED Triage Notes (Signed)
Patient states he woke up this morning with his mid back hurting. He went to work and was unable to perform his job at work. He states it hurts when he takes a deep breath.

## 2019-07-23 NOTE — Discharge Instructions (Signed)
It was very nice seeing you today in clinic. Thank you for entrusting me with your care.   Rest and avoid lifting for the next few days. Try low intensity stretches as discussed. Use anti-inflammatory medication and muscle relaxers as needed. Apply moist heat 3-4 times a day for at least 15-20 minutes at a time.  Make arrangements to follow up with your regular doctor in 1 week for re-evaluation if not improving. If your symptoms/condition worsens, please seek follow up care either here or in the ER. Please remember, our [facility] providers are "right here with you" when you need [place].   Again, it was my pleasure to take care of you today. Thank you for choosing our clinic. I hope that you start to feel better quickly.   Honor Loh, MSN, APRN, FNP-C, CEN Advanced Practice Provider [facility]
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
## 2019-09-07 DIAGNOSIS — S62002K Unspecified fracture of navicular [scaphoid] bone of left wrist, subsequent encounter for fracture with nonunion: Secondary | ICD-10-CM | POA: Insufficient documentation

## 2019-10-20 DIAGNOSIS — Z4889 Encounter for other specified surgical aftercare: Secondary | ICD-10-CM | POA: Insufficient documentation

## 2019-10-20 DIAGNOSIS — G5602 Carpal tunnel syndrome, left upper limb: Secondary | ICD-10-CM | POA: Insufficient documentation

## 2019-12-04 DIAGNOSIS — N529 Male erectile dysfunction, unspecified: Secondary | ICD-10-CM | POA: Insufficient documentation

## 2019-12-07 ENCOUNTER — Other Ambulatory Visit: Payer: Self-pay | Admitting: Family Medicine

## 2019-12-07 DIAGNOSIS — N5089 Other specified disorders of the male genital organs: Secondary | ICD-10-CM

## 2019-12-11 ENCOUNTER — Ambulatory Visit: Admission: RE | Admit: 2019-12-11 | Payer: PRIVATE HEALTH INSURANCE | Source: Ambulatory Visit

## 2019-12-18 ENCOUNTER — Ambulatory Visit: Payer: PRIVATE HEALTH INSURANCE | Attending: Internal Medicine

## 2019-12-18 DIAGNOSIS — Z23 Encounter for immunization: Secondary | ICD-10-CM

## 2019-12-18 NOTE — Progress Notes (Signed)
   Covid-19 Vaccination Clinic  Name:  Micheal Maxwell    MRN: [id number] DOB: February 17, 1971  12/18/2019  Micheal Maxwell was observed post Covid-19 immunization for 15 minutes without incident. He was provided with Vaccine Information Sheet and instruction to access the V-Safe system.   Micheal Maxwell was instructed to call 911 with any severe reactions post vaccine: Marland Kitchen Difficulty breathing  . Swelling of face and throat  . A fast heartbeat  . A bad rash all over body  . Dizziness and weakness   Immunizations Administered    Name Date Dose VIS Date Route   Pfizer COVID-19 Vaccine 12/18/2019 12:43 PM 0.3 mL 08/28/2019 Intramuscular   Manufacturer: [place]   Lot: [id number]   [place]: [id number]

## 2020-01-08 ENCOUNTER — Ambulatory Visit: Payer: PRIVATE HEALTH INSURANCE

## 2020-01-08 ENCOUNTER — Ambulatory Visit: Payer: PRIVATE HEALTH INSURANCE | Attending: Internal Medicine

## 2020-01-08 NOTE — Progress Notes (Signed)
   Covid-19 Vaccination Clinic  Name:  Micheal Maxwell    MRN: [id number] DOB: 08-Apr-1971  01/08/2020  [name] was observed post Covid-19 immunization for 15 minutes without incident. He was provided with Vaccine Information Sheet and instruction to access the V-Safe system.   [name] was instructed to call 911 with any severe reactions post vaccine: Marland Kitchen Difficulty breathing  . Swelling of face and throat  . A fast heartbeat  . A bad rash all over body  . Dizziness and weakness

## 2020-01-08 NOTE — Progress Notes (Deleted)
   Covid-19 Vaccination Clinic  Name:  Micheal Maxwell    MRN: [id number] DOB: [birth year]  01/08/2020  [name] was observed post Covid-19 immunization for 15 minutes without incident. He was provided with Vaccine Information Sheet and instruction to access the V-Safe system.   [name] was instructed to call 911 with any severe reactions post vaccine: Marland Kitchen Difficulty breathing  . Swelling of face and throat  . A fast heartbeat  . A bad rash all over body  . Dizziness and weakness    *** Covid vaccine administration is NOT RECORDED.  Must document administration and refresh note before signing ***

## 2020-01-08 NOTE — Progress Notes (Signed)
   Covid-19 Vaccination Clinic  Name:  Micheal Maxwell    MRN: [id number] DOB: 1971/08/25  01/08/2020  Micheal Maxwell was observed post Covid-19 immunization for 15 minutes without incident. He was provided with Vaccine Information Sheet and instruction to access the V-Safe system.   Micheal Maxwell was instructed to call 911 with any severe reactions post vaccine: Marland Kitchen Difficulty breathing  . Swelling of face and throat  . A fast heartbeat  . A bad rash all over body  . Dizziness and weakness   Immunizations Administered    Name Date Dose VIS Date Route   Pfizer COVID-19 Vaccine 01/08/2020  5:45 PM 0.3 mL 11/11/2018 Intramuscular   Manufacturer: [employer]   Lot: [id number]   [place]: [id number]

## 2020-04-10 ENCOUNTER — Ambulatory Visit
Admission: RE | Admit: 2020-04-10 | Discharge: 2020-04-10 | Disposition: A | Discharge status: Home / Self Care | Payer: PRIVATE HEALTH INSURANCE | Source: Ambulatory Visit | Attending: Family Medicine | Admitting: Family Medicine

## 2020-04-10 ENCOUNTER — Encounter: Payer: Self-pay | Admitting: Emergency Medicine

## 2020-04-10 ENCOUNTER — Other Ambulatory Visit: Payer: Self-pay

## 2020-04-10 ENCOUNTER — Ambulatory Visit
Admission: EM | Admit: 2020-04-10 | Discharge: 2020-04-10 | Disposition: A | Discharge status: Home / Self Care | Payer: PRIVATE HEALTH INSURANCE | Attending: Family Medicine | Admitting: Family Medicine

## 2020-04-10 ENCOUNTER — Telehealth: Payer: Self-pay | Admitting: Family Medicine

## 2020-04-10 DIAGNOSIS — I82442 Acute embolism and thrombosis of left tibial vein: Secondary | ICD-10-CM | POA: Insufficient documentation

## 2020-04-10 DIAGNOSIS — I82423 Acute embolism and thrombosis of iliac vein, bilateral: Secondary | ICD-10-CM | POA: Insufficient documentation

## 2020-04-10 DIAGNOSIS — R600 Localized edema: Secondary | ICD-10-CM | POA: Diagnosis not present

## 2020-04-10 DIAGNOSIS — M7989 Other specified soft tissue disorders: Secondary | ICD-10-CM | POA: Diagnosis not present

## 2020-04-10 DIAGNOSIS — I82432 Acute embolism and thrombosis of left popliteal vein: Secondary | ICD-10-CM | POA: Diagnosis present

## 2020-04-10 MED ORDER — RIVAROXABAN 20 MG PO TABS: 20.0000 mg | ORAL_TABLET | Freq: Every day | ORAL | 0 refills | Status: DC

## 2020-04-10 MED ORDER — RIVAROXABAN (XARELTO) VTE STARTER PACK (15 & 20 MG): ORAL_TABLET | ORAL | 0 refills | Status: DC
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
## 2020-04-10 NOTE — Discharge Instructions (Signed)
Go straight to the medical mall.   I will be in touch.  Take care  Dr. Lacinda Axon

## 2020-04-10 NOTE — ED Triage Notes (Signed)
Patient c/o left lower leg pain and swelling off and for the past 4-5 days. Patient denies injury or fall.

## 2020-04-10 NOTE — Telephone Encounter (Signed)
Patient informed of [place] result. Starting on Xarelto. Referral placed to heme/onc.  [facility]
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
## 2020-04-25 ENCOUNTER — Inpatient Hospital Stay: Payer: PRIVATE HEALTH INSURANCE

## 2020-04-25 ENCOUNTER — Inpatient Hospital Stay: Payer: PRIVATE HEALTH INSURANCE | Attending: Hematology and Oncology | Admitting: Hematology and Oncology

## 2020-04-25 ENCOUNTER — Encounter: Payer: Self-pay | Admitting: Hematology and Oncology

## 2020-04-25 ENCOUNTER — Other Ambulatory Visit: Payer: Self-pay

## 2020-04-25 VITALS — BP 125/79 | HR 79 | Temp 98.5°F | Resp 16 | Wt 187.7 lb

## 2020-04-25 DIAGNOSIS — R51 Headache with orthostatic component, not elsewhere classified: Secondary | ICD-10-CM | POA: Diagnosis not present

## 2020-04-25 DIAGNOSIS — R3915 Urgency of urination: Secondary | ICD-10-CM | POA: Diagnosis not present

## 2020-04-25 DIAGNOSIS — F17210 Nicotine dependence, cigarettes, uncomplicated: Secondary | ICD-10-CM | POA: Diagnosis not present

## 2020-04-25 DIAGNOSIS — Z79899 Other long term (current) drug therapy: Secondary | ICD-10-CM | POA: Insufficient documentation

## 2020-04-25 DIAGNOSIS — R600 Localized edema: Secondary | ICD-10-CM | POA: Diagnosis not present

## 2020-04-25 DIAGNOSIS — Z86718 Personal history of other venous thrombosis and embolism: Secondary | ICD-10-CM | POA: Insufficient documentation

## 2020-04-25 DIAGNOSIS — I82432 Acute embolism and thrombosis of left popliteal vein: Secondary | ICD-10-CM | POA: Diagnosis not present

## 2020-04-25 DIAGNOSIS — Z7901 Long term (current) use of anticoagulants: Secondary | ICD-10-CM | POA: Insufficient documentation

## 2020-04-25 LAB — CBC WITH DIFFERENTIAL/PLATELET
Abs Immature Granulocytes: 0.02 10*3/uL (ref 0.00–0.07)
Basophils Absolute: 0 10*3/uL (ref 0.0–0.1)
Basophils Relative: 1 %
Eosinophils Absolute: 0.2 10*3/uL (ref 0.0–0.5)
Eosinophils Relative: 4 %
HCT: 38.8 % — ABNORMAL LOW (ref 39.0–52.0)
Hemoglobin: 12.8 g/dL — ABNORMAL LOW (ref 13.0–17.0)
Immature Granulocytes: 0 %
Lymphocytes Relative: 35 %
Lymphs Abs: 2 10*3/uL (ref 0.7–4.0)
MCH: 26.2 pg (ref 26.0–34.0)
MCHC: 33 g/dL (ref 30.0–36.0)
MCV: 79.3 fL — ABNORMAL LOW (ref 80.0–100.0)
Monocytes Absolute: 0.4 10*3/uL (ref 0.1–1.0)
Monocytes Relative: 6 %
Neutro Abs: 3.1 10*3/uL (ref 1.7–7.7)
Neutrophils Relative %: 54 %
Platelets: 284 10*3/uL (ref 150–400)
RBC: 4.89 MIL/uL (ref 4.22–5.81)
RDW: 16.8 % — ABNORMAL HIGH (ref 11.5–15.5)
WBC: 5.8 10*3/uL (ref 4.0–10.5)
nRBC: 0 % (ref 0.0–0.2)

## 2020-04-25 LAB — COMPREHENSIVE METABOLIC PANEL
ALT: 22 U/L (ref 0–44)
AST: 22 U/L (ref 15–41)
Albumin: 3.9 g/dL (ref 3.5–5.0)
Alkaline Phosphatase: 81 U/L (ref 38–126)
Anion gap: 9 (ref 5–15)
BUN: 14 mg/dL (ref 6–20)
CO2: 26 mmol/L (ref 22–32)
Calcium: 9.2 mg/dL (ref 8.9–10.3)
Chloride: 104 mmol/L (ref 98–111)
Creatinine, Ser: 1.19 mg/dL (ref 0.61–1.24)
GFR calc Af Amer: >60 mL/min (ref >60)
GFR calc non Af Amer: >60 mL/min (ref >60)
Glucose, Bld: 115 mg/dL — ABNORMAL HIGH (ref 70–99)
Potassium: 3.5 mmol/L (ref 3.5–5.1)
Sodium: 139 mmol/L (ref 135–145)
Total Bilirubin: 0.6 mg/dL (ref 0.3–1.2)
Total Protein: 6.7 g/dL (ref 6.5–8.1)

## 2020-04-25 NOTE — Progress Notes (Signed)
Patient here for initial oncology appointment, expresses concerns of urinary frequency and blood in rectum, pain in leg at times.

## 2020-04-26 LAB — PSA: Prostatic Specific Antigen: 2.5 ng/mL (ref 0.00–4.00)

## 2020-04-27 LAB — LUPUS ANTICOAGULANT PANEL
DRVVT: 119.3 s — ABNORMAL HIGH (ref 0.0–47.0)
PTT Lupus Anticoagulant: 40.3 s (ref 0.0–51.9)

## 2020-04-27 LAB — DRVVT CONFIRM: dRVVT Confirm: 2 ratio — ABNORMAL HIGH (ref 0.8–1.2)

## 2020-04-27 LAB — BETA-2-GLYCOPROTEIN I ABS, IGG/M/A
Beta-2 Glyco I IgG: <9 GPI IgG units (ref 0–20)
Beta-2-Glycoprotein I IgA: <9 GPI IgA units (ref 0–25)
Beta-2-Glycoprotein I IgM: <9 GPI IgM units (ref 0–32)

## 2020-04-27 LAB — CARDIOLIPIN ANTIBODIES, IGG, IGM, IGA
Anticardiolipin IgA: <9 APL U/mL (ref 0–11)
Anticardiolipin IgG: <9 GPL U/mL (ref 0–14)
Anticardiolipin IgM: 10 MPL U/mL (ref 0–12)

## 2020-04-27 LAB — DRVVT MIX: dRVVT Mix: 80.2 s — ABNORMAL HIGH (ref 0.0–40.4)

## 2020-04-28 LAB — PROTHROMBIN GENE MUTATION

## 2020-04-28 LAB — FACTOR 5 LEIDEN

## 2020-05-03 ENCOUNTER — Other Ambulatory Visit: Payer: Self-pay | Admitting: Hematology and Oncology

## 2020-05-03 ENCOUNTER — Inpatient Hospital Stay: Payer: PRIVATE HEALTH INSURANCE | Admitting: Hematology and Oncology

## 2020-05-03 DIAGNOSIS — D509 Iron deficiency anemia, unspecified: Secondary | ICD-10-CM | POA: Insufficient documentation

## 2020-05-05 ENCOUNTER — Encounter: Payer: Self-pay | Admitting: Podiatry

## 2020-05-05 ENCOUNTER — Other Ambulatory Visit: Payer: Self-pay

## 2020-05-05 ENCOUNTER — Ambulatory Visit: Payer: PRIVATE HEALTH INSURANCE | Admitting: Podiatry

## 2020-05-05 DIAGNOSIS — L603 Nail dystrophy: Secondary | ICD-10-CM | POA: Diagnosis not present

## 2020-05-05 DIAGNOSIS — M79675 Pain in left toe(s): Secondary | ICD-10-CM

## 2020-05-05 NOTE — Patient Instructions (Signed)

## 2020-05-06 ENCOUNTER — Encounter: Payer: Self-pay | Admitting: Podiatry

## 2020-05-06 NOTE — Progress Notes (Signed)
  Subjective:  Patient ID: Micheal Maxwell, male    DOB: 1971/05/20,  MRN: [id number]  Chief Complaint  Patient presents with  . Nail Problem    Patient presents today for tender left great toenail x 1 week.  He had both great toenails removed last year and the left grew back painful    49 y.o. male presents with the above complaint.  Patient presents with left great toenail dystrophy with pain on palpation to the entire nail.  Patient had it removed in the past for which the nail grew back.  He would like to have it removed again and try 1 more time.  He does not want a permanent.  He denies any other acute complaints.  He has not seen anyone else prior to seeing me beside the removal couple of years ago.  He denies any other acute complaints.  He would like to have this nail removed.   Review of Systems: Negative except as noted in the HPI. Denies N/V/F/Ch.  No past medical history on file.  Current Outpatient Medications:  .  acetaminophen (TYLENOL) 325 MG tablet, Take 650 mg by mouth every 6 (six) hours as needed., Disp: , Rfl:  .  Pramoxine HCl (TUCKS HEMORRHOIDAL RE), Place rectally., Disp: , Rfl:  .  rivaroxaban (XARELTO) 20 MG TABS tablet, Take 1 tablet (20 mg total) by mouth daily. Start after Starter pack. Take with food., Disp: 60 tablet, Rfl: 0 .  tadalafil (CIALIS) 10 MG tablet, Take 10 mg by mouth daily as needed for erectile dysfunction., Disp: , Rfl:   Social History   Tobacco Use  Smoking Status Current Every Day Smoker  . Types: Cigars  Smokeless Tobacco Never Used    No Known Allergies Objective:  There were no vitals filed for this visit. There is no height or weight on file to calculate BMI. Constitutional Well developed. Well nourished.  Vascular Dorsalis pedis pulses palpable bilaterally. Posterior tibial pulses palpable bilaterally. Capillary refill normal to all digits.  No cyanosis or clubbing noted. Pedal hair growth normal.  Neurologic Normal  speech. Oriented to person, place, and time. Epicritic sensation to light touch grossly present bilaterally.  Dermatologic Pain on palpation of the entire/total nail on 1st digit of the left No other open wounds. No skin lesions.  Orthopedic: Normal joint ROM without pain or crepitus bilaterally. No visible deformities. No bony tenderness.   Radiographs: None Assessment:   1. Nail dystrophy   2. Great toe pain, left    Plan:  Patient was evaluated and treated and all questions answered.  Nail contusion/dystrophy hallux, left -Patient elects to proceed with minor surgery to remove entire toenail today. Consent reviewed and signed by patient. -Entire/total nail excised. See procedure note. -Educated on post-procedure care including soaking. Written instructions provided and reviewed. -Patient to follow up in 2 weeks for nail check.  Procedure: Excision of entire/total nail  Location: Left 1st toe digit Anesthesia: Lidocaine 1% plain; 1.5 mL and Marcaine 0.5% plain; 1.5 mL, digital block. Skin Prep: Betadine. Dressing: Silvadene; telfa; dry, sterile, compression dressing. Technique: Following skin prep, the toe was exsanguinated and a tourniquet was secured at the base of the toe. The affected nail border was freed and excised. The tourniquet was then removed and sterile dressing applied. Disposition: Patient tolerated procedure well. Patient to return in 2 weeks for follow-up.   No follow-ups on file.

## 2021-07-11 ENCOUNTER — Ambulatory Visit
Admission: EM | Admit: 2021-07-11 | Discharge: 2021-07-11 | Disposition: A | Discharge status: Home / Self Care | Payer: Managed Care, Other (non HMO) | Attending: Physician Assistant | Admitting: Physician Assistant

## 2021-07-11 ENCOUNTER — Other Ambulatory Visit: Payer: Self-pay

## 2021-07-11 ENCOUNTER — Encounter: Payer: Self-pay | Admitting: Emergency Medicine

## 2021-07-11 DIAGNOSIS — J069 Acute upper respiratory infection, unspecified: Secondary | ICD-10-CM

## 2021-07-11 DIAGNOSIS — Z7901 Long term (current) use of anticoagulants: Secondary | ICD-10-CM | POA: Diagnosis not present

## 2021-07-11 DIAGNOSIS — Z86718 Personal history of other venous thrombosis and embolism: Secondary | ICD-10-CM | POA: Diagnosis not present

## 2021-07-11 DIAGNOSIS — R059 Cough, unspecified: Secondary | ICD-10-CM | POA: Diagnosis present

## 2021-07-11 DIAGNOSIS — F17290 Nicotine dependence, other tobacco product, uncomplicated: Secondary | ICD-10-CM | POA: Insufficient documentation

## 2021-07-11 DIAGNOSIS — J029 Acute pharyngitis, unspecified: Secondary | ICD-10-CM | POA: Insufficient documentation

## 2021-07-11 DIAGNOSIS — Z20822 Contact with and (suspected) exposure to covid-19: Secondary | ICD-10-CM | POA: Insufficient documentation

## 2021-07-11 MED ORDER — BENZONATATE 100 MG PO CAPS: 100.0000 mg | ORAL_CAPSULE | Freq: Three times a day (TID) | ORAL | 0 refills | Status: DC

## 2021-07-11 MED ORDER — HYDROCODONE BIT-HOMATROP MBR 5-1.5 MG/5ML PO SOLN
5.0000 mL | Freq: Every evening | ORAL | 0 refills | Status: DC | PRN
Start: 2021-07-11 — End: 2021-10-02

## 2021-07-11 MED ORDER — AZITHROMYCIN 250 MG PO TABS: ORAL_TABLET | ORAL | 0 refills | Status: DC

## 2021-07-11 MED ORDER — FLUTICASONE PROPIONATE 50 MCG/ACT NA SUSP: 2.0000 | Freq: Every day | NASAL | 0 refills | Status: DC

## 2021-07-11 NOTE — ED Triage Notes (Signed)
Pt presents today with nasal congestion and fatigue x 3 days. Denies fever. He has been treating with Dayquil (last taken this a.m.). No home Covid test. Pt requests to be tested for Covid today.

## 2021-07-11 NOTE — Discharge Instructions (Addendum)
-  Remain at home until COVID results are known. -If COVID-negative begin taking Z-Pak for upper respiratory infection at this time. -Tessalon as needed for cough suppression and Hycodan as needed for cough symptoms at this time. Work note has been provided.  Follow-up if symptoms worsen.

## 2021-07-12 LAB — SARS CORONAVIRUS 2 (TAT 6-24 HRS): SARS Coronavirus 2: NEGATIVE
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
## 2021-10-02 ENCOUNTER — Encounter: Payer: Self-pay | Admitting: Emergency Medicine

## 2021-10-02 ENCOUNTER — Other Ambulatory Visit: Payer: Self-pay

## 2021-10-02 ENCOUNTER — Ambulatory Visit
Admission: EM | Admit: 2021-10-02 | Discharge: 2021-10-02 | Disposition: A | Discharge status: Home / Self Care | Payer: Managed Care, Other (non HMO) | Attending: Physician Assistant | Admitting: Physician Assistant

## 2021-10-02 DIAGNOSIS — J069 Acute upper respiratory infection, unspecified: Secondary | ICD-10-CM | POA: Insufficient documentation

## 2021-10-02 DIAGNOSIS — R051 Acute cough: Secondary | ICD-10-CM | POA: Diagnosis not present

## 2021-10-02 DIAGNOSIS — R0981 Nasal congestion: Secondary | ICD-10-CM | POA: Diagnosis not present

## 2021-10-02 DIAGNOSIS — Z20822 Contact with and (suspected) exposure to covid-19: Secondary | ICD-10-CM | POA: Insufficient documentation

## 2021-10-02 LAB — RESP PANEL BY RT-PCR (FLU A&B, COVID) ARPGX2
Influenza A by PCR: NEGATIVE
Influenza B by PCR: NEGATIVE
SARS Coronavirus 2 by RT PCR: NEGATIVE

## 2021-10-02 MED ORDER — PSEUDOEPH-BROMPHEN-DM 30-2-10 MG/5ML PO SYRP: 10.0000 mL | ORAL_SOLUTION | Freq: Four times a day (QID) | ORAL | 0 refills | Status: AC | PRN

## 2021-10-02 NOTE — Discharge Instructions (Addendum)
URI/COLD SYMPTOMS: Your exam today is consistent with a viral illness. Antibiotics are not indicated at this time. Use medications as directed, including cough syrup, nasal saline, and decongestants. Your symptoms should improve over the next few days and resolve within 7-10 days. Increase rest and fluids. F/u if symptoms worsen or predominate such as sore throat, ear pain, productive cough, shortness of breath, or if you develop high fevers or worsening fatigue over the next several days.    
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
## 2021-10-02 NOTE — ED Triage Notes (Signed)
Pt c/o cough, nasal congestion, runny nose. Started about 3 days ago. Denies fever.

## 2021-12-05 IMAGING — US US EXTREM LOW VENOUS*L*
1 series · 13 of 24 positions shown · non-contrast
Comparison: None.
COMPARISON: None.

Addendum:
CLINICAL DATA: Left lower extremity pain and swelling 4-5 days.

EXAM:
LEFT LOWER EXTREMITY VENOUS DOPPLER ULTRASOUND
TECHNIQUE: Gray-scale sonography with compression, as well as color and duplex
ultrasound, were performed to evaluate the deep venous system(s)
from the level of the common femoral vein through the popliteal and
proximal calf veins.

[Series 1: us extrem low venous*left* · 0.09mm/px · 13 of 35 slices shown]
[im 1/35]
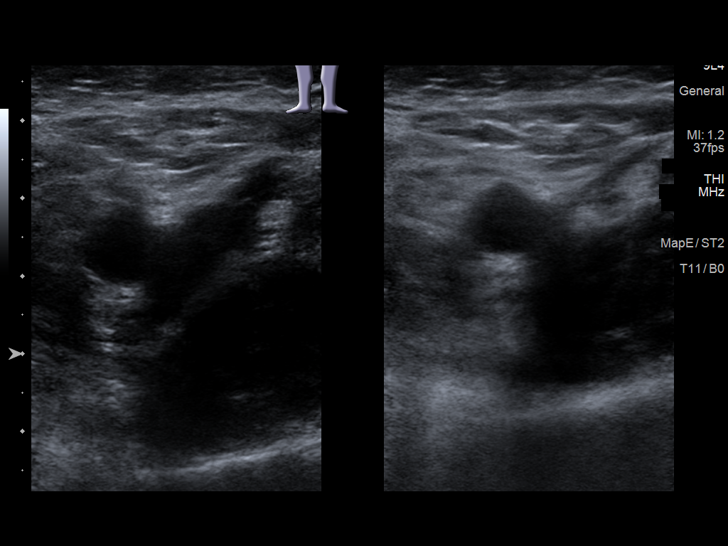
[im 3/35]
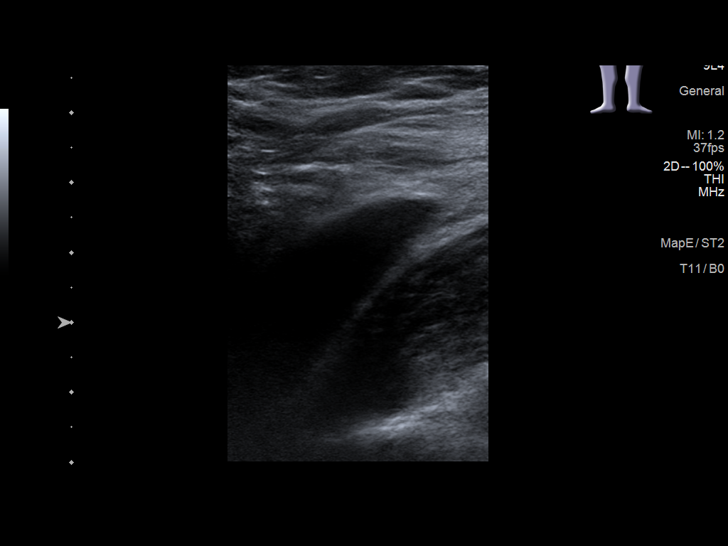
[im 6/35]
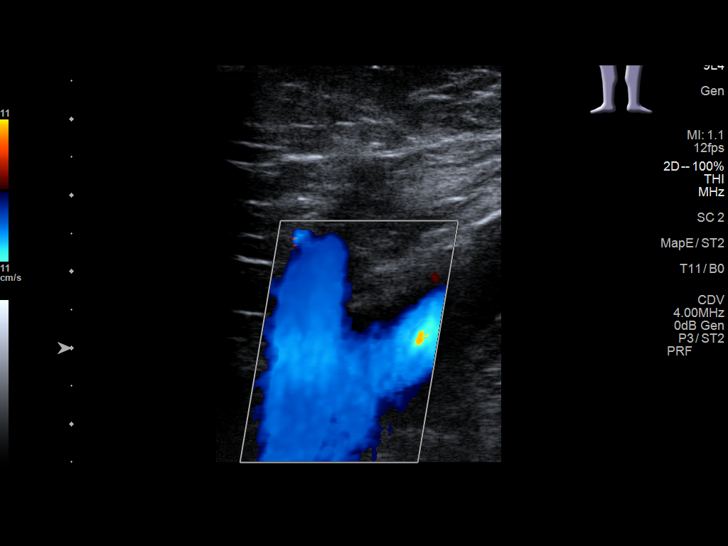
[im 9/35]
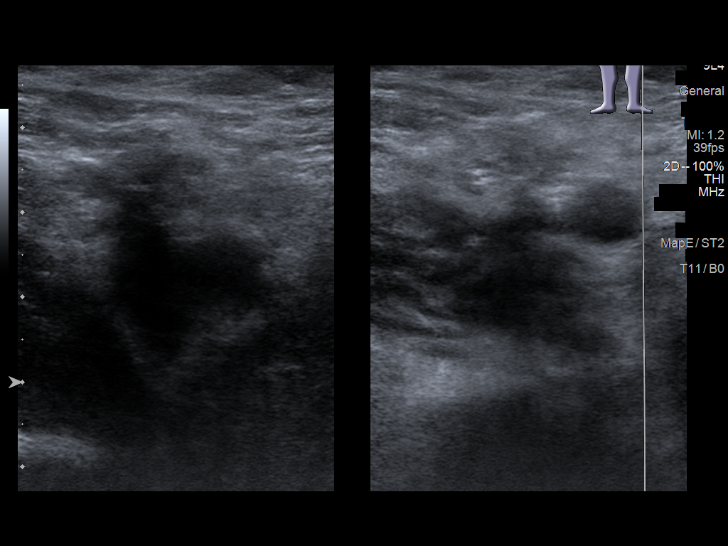
[im 12/35]
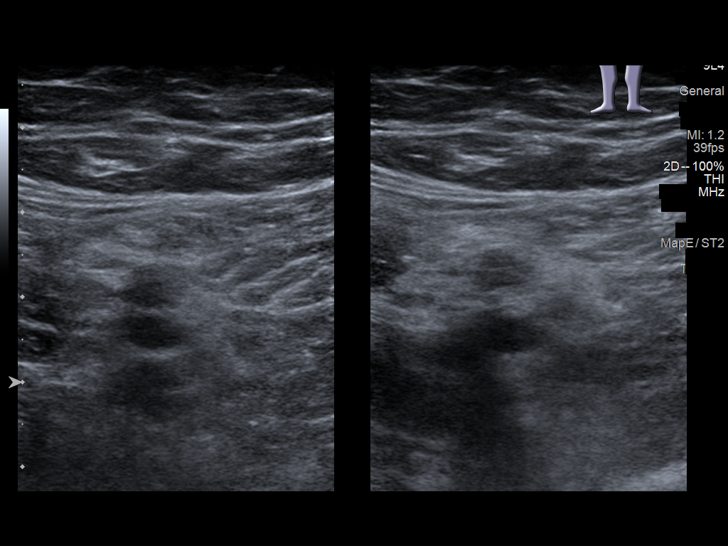
[im 15/35]
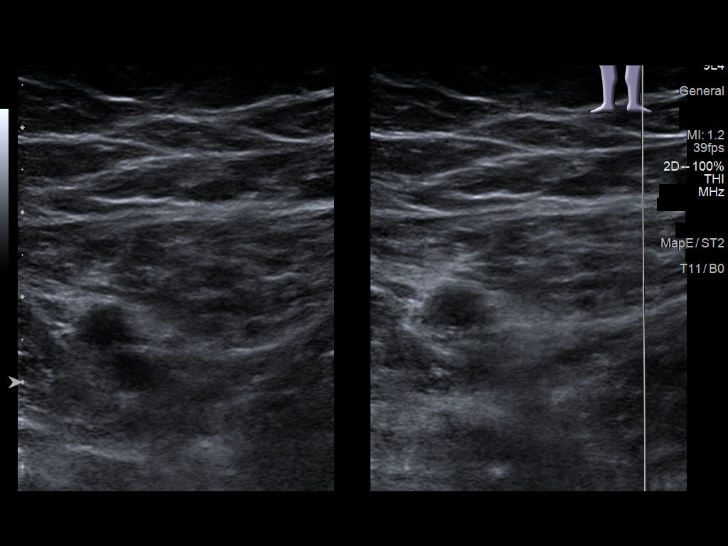
[im 18/35]
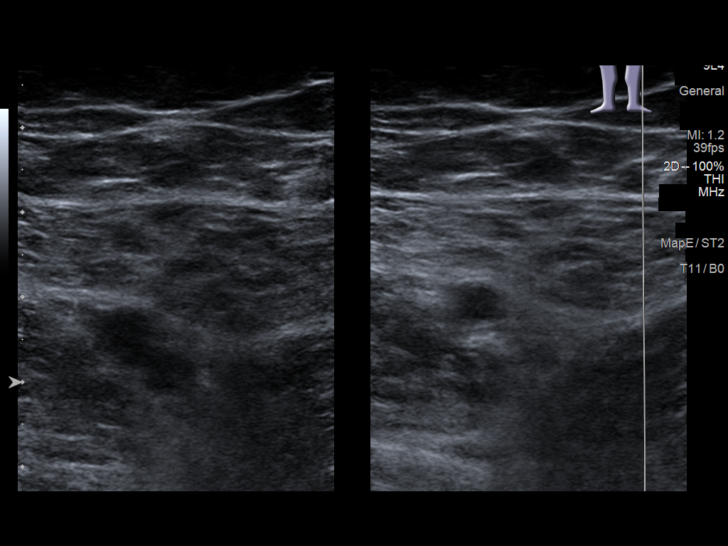
[im 20/35]
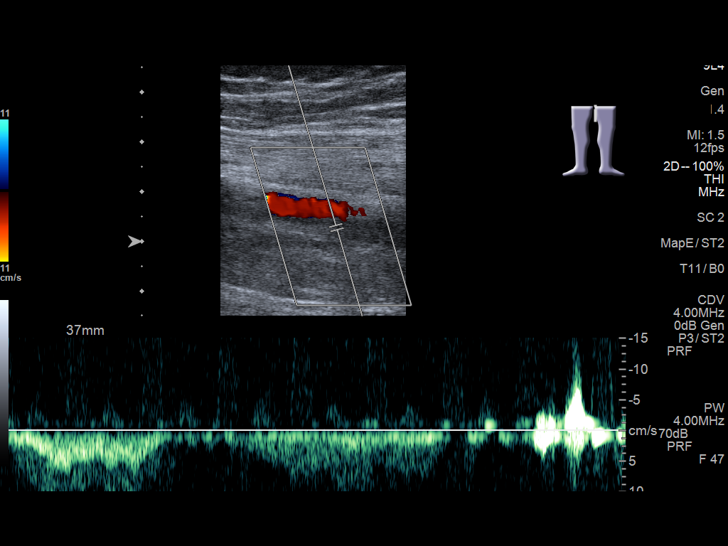
[im 23/35]
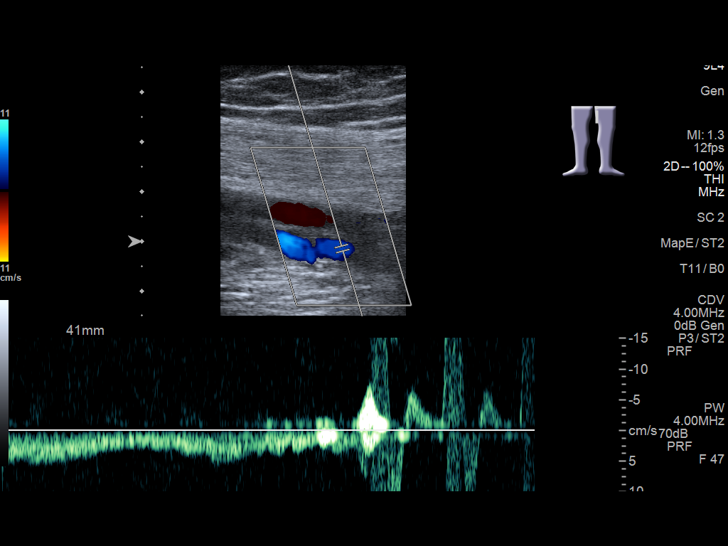
[im 26/35]
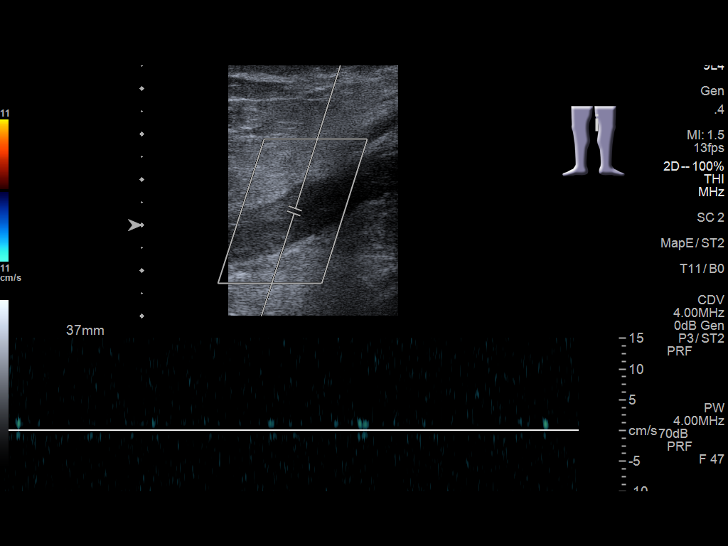
[im 29/35]
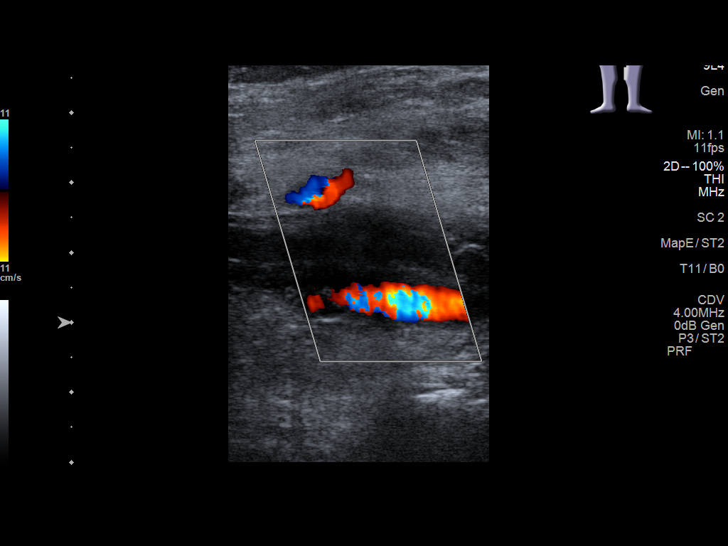
[im 32/35]
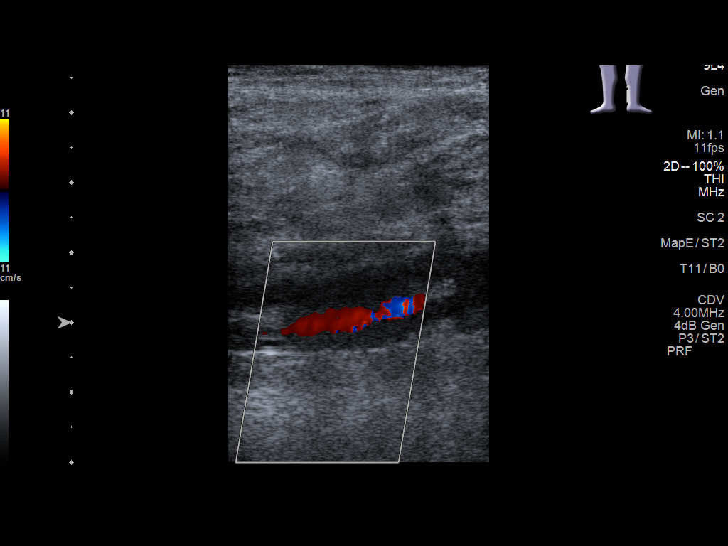
[im 35/35]
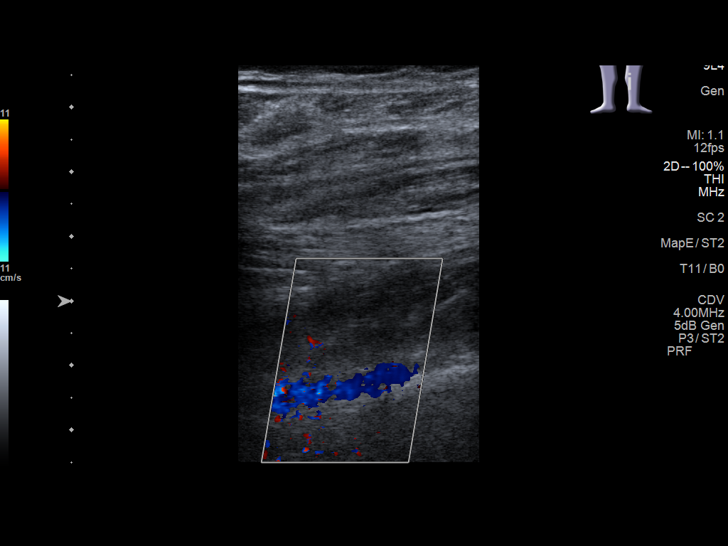

[13 of 24 positions shown; findings below may reference images not displayed]

FINDINGS: VENOUS

Normal compressibility of the common femoral and superficial femoral
veins. Visualized portions of profunda femoral vein and great
saphenous vein unremarkable. No filling defects to suggest DVT on
grayscale or color Doppler imaging. Doppler waveforms show normal
direction of venous flow, normal respiratory plasticity and response
to augmentation involving the above veins.

There is occlusive noncompressible thrombus involving the popliteal
vein extending to the posterior tibial veins of the lower leg.

Limited views of the contralateral common femoral vein are
unremarkable.

OTHER

None.

Limitations: none
IMPRESSION: Occlusive thrombus involving the left popliteal vein extending to
the posterior tibial veins of the lower leg.

ADDENDUM:
These results were called by telephone at the time of interpretation
on 04/10/2020 at [DATE] p.m. to provider Dr. Ladybug, who verbally
acknowledged these results.

*** End of Addendum ***
FINDINGS: VENOUS

Normal compressibility of the common femoral and superficial femoral
veins. Visualized portions of profunda femoral vein and great
saphenous vein unremarkable. No filling defects to suggest DVT on
grayscale or color Doppler imaging. Doppler waveforms show normal
direction of venous flow, normal respiratory plasticity and response
to augmentation involving the above veins.

There is occlusive noncompressible thrombus involving the popliteal
vein extending to the posterior tibial veins of the lower leg.

Limited views of the contralateral common femoral vein are
unremarkable.

OTHER

None.

Limitations: none
IMPRESSION: Occlusive thrombus involving the left popliteal vein extending to
the posterior tibial veins of the lower leg.

## 2022-05-30 ENCOUNTER — Ambulatory Visit
Admission: RE | Admit: 2022-05-30 | Discharge: 2022-05-30 | Disposition: A | Discharge status: Home / Self Care | Payer: No Typology Code available for payment source | Source: Ambulatory Visit | Attending: Family Medicine | Admitting: Family Medicine

## 2022-05-30 ENCOUNTER — Other Ambulatory Visit: Payer: Self-pay | Admitting: Family Medicine

## 2022-05-30 DIAGNOSIS — M79662 Pain in left lower leg: Secondary | ICD-10-CM | POA: Insufficient documentation

## 2022-05-30 DIAGNOSIS — Z86718 Personal history of other venous thrombosis and embolism: Secondary | ICD-10-CM | POA: Diagnosis present

## 2022-06-19 ENCOUNTER — Emergency Department
Admission: EM | Admit: 2022-06-19 | Discharge: 2022-06-19 | Disposition: A | Discharge status: Home / Self Care | Payer: No Typology Code available for payment source | Attending: Emergency Medicine | Admitting: Emergency Medicine

## 2022-06-19 ENCOUNTER — Other Ambulatory Visit: Payer: Self-pay

## 2022-06-19 DIAGNOSIS — K625 Hemorrhage of anus and rectum: Secondary | ICD-10-CM | POA: Diagnosis present

## 2022-06-19 DIAGNOSIS — K649 Unspecified hemorrhoids: Secondary | ICD-10-CM

## 2022-06-19 DIAGNOSIS — K644 Residual hemorrhoidal skin tags: Secondary | ICD-10-CM | POA: Insufficient documentation

## 2022-06-19 LAB — CBC
HCT: 36.2 % — ABNORMAL LOW (ref 39.0–52.0)
Hemoglobin: 11.7 g/dL — ABNORMAL LOW (ref 13.0–17.0)
MCH: 25.8 pg — ABNORMAL LOW (ref 26.0–34.0)
MCHC: 32.3 g/dL (ref 30.0–36.0)
MCV: 79.7 fL — ABNORMAL LOW (ref 80.0–100.0)
Platelets: 215 10*3/uL (ref 150–400)
RBC: 4.54 MIL/uL (ref 4.22–5.81)
RDW: 16.4 % — ABNORMAL HIGH (ref 11.5–15.5)
WBC: 3.6 10*3/uL — ABNORMAL LOW (ref 4.0–10.5)
nRBC: 0 % (ref 0.0–0.2)

## 2022-06-19 LAB — COMPREHENSIVE METABOLIC PANEL
ALT: 15 U/L (ref 0–44)
AST: 18 U/L (ref 15–41)
Albumin: 3.8 g/dL (ref 3.5–5.0)
Alkaline Phosphatase: 64 U/L (ref 38–126)
Anion gap: 4 — ABNORMAL LOW (ref 5–15)
BUN: 10 mg/dL (ref 6–20)
CO2: 25 mmol/L (ref 22–32)
Calcium: 9 mg/dL (ref 8.9–10.3)
Chloride: 109 mmol/L (ref 98–111)
Creatinine, Ser: 0.95 mg/dL (ref 0.61–1.24)
GFR, Estimated: >60 mL/min (ref >60)
Glucose, Bld: 97 mg/dL (ref 70–99)
Potassium: 3.5 mmol/L (ref 3.5–5.1)
Sodium: 138 mmol/L (ref 135–145)
Total Bilirubin: 0.5 mg/dL (ref 0.3–1.2)
Total Protein: 6.6 g/dL (ref 6.5–8.1)

## 2022-06-19 LAB — LIPASE, BLOOD: Lipase: 33 U/L (ref 11–51)

## 2022-06-19 MED ORDER — OXYCODONE-ACETAMINOPHEN 5-325 MG PO TABS: 1.0000 | ORAL_TABLET | Freq: Once | ORAL | Status: DC

## 2022-06-19 NOTE — ED Provider Notes (Signed)
[facility] Provider Note    Event Date/Time   First MD Initiated Contact with Patient 06/19/22 [phone]     (approximate)   History   Rectal Bleeding   HPI  Micheal Maxwell is a 51 y.o. male who presents to the emergency department for rectal bleeding.  Patient states that his been an ongoing issue for multiple years but worsened over the past couple of days.  Endorses dark red blood with bowel movements and wiping.  Does state that he has a history of hemorrhoids.  Endorses ongoing dark red blood mixed with his stool and after wiping.  Denies any significant abdominal pain or rectal pain.  Denies shortness of breath, lightheadedness or chest pain.  History of DVT that was unprovoked but no longer on anticoagulation.  Recent physical exam from primary care physician that was reassuring.  Last colonoscopy was 4 to 5 years ago, 1 polyp was removed with no complications.  Denies any significant alcohol use.  No history of gastritis or GERD.  Denies any significant indigestion symptoms.  Denies Goody powder or NSAID use.     Physical Exam   Triage Vital Signs: ED Triage Vitals  Enc Vitals Group     BP 06/19/22 0735 (!) 137/96     Pulse Rate 06/19/22 0735 71     Resp 06/19/22 0735 17     Temp 06/19/22 0735 97.9 F (36.6 C)     Temp src --      SpO2 06/19/22 0735 96 %     Weight --      Height --      Head Circumference --      Peak Flow --      Pain Score 06/19/22 0734 0     Pain Loc --      Pain Edu? --      Excl. in House? --     Most recent vital signs: Vitals:   06/19/22 0735  BP: (!) 137/96  Pulse: 71  Resp: 17  Temp: 97.9 F (36.6 C)  SpO2: 96%     General: Awake, no distress.  CV:  Good peripheral perfusion.  Regular rate Resp:  Normal effort.  Abd:  No distention.  Nontender to palpation GU:   DRE with external hemorrhoid with no active bleeding.  No gross blood or melena.  No obvious fissures or tears.  No obvious mass. Other:     ED  Results / Procedures / Treatments   Labs (all labs ordered are listed, but only abnormal results are displayed) Labs Reviewed  COMPREHENSIVE METABOLIC PANEL - Abnormal; Notable for the following components:      Result Value   Anion gap 4 (*)    All other components within normal limits  CBC - Abnormal; Notable for the following components:   WBC 3.6 (*)    Hemoglobin 11.7 (*)    HCT 36.2 (*)    MCV 79.7 (*)    MCH 25.8 (*)    RDW 16.4 (*)    All other components within normal limits  LIPASE, BLOOD  URINALYSIS, ROUTINE W REFLEX MICROSCOPIC     EKG     RADIOLOGY     PROCEDURES:  Critical Care performed: No  Procedures   MEDICATIONS ORDERED IN ED: Medications - No data to display   IMPRESSION / MDM / [place] / ED COURSE  I reviewed the triage vital signs and the nursing notes.   Differential diagnosis includes,  but is not limited to, gastritis/PUD, upper GI bleed, AVM, diverticulosis, external hemorrhoid, internal hemorrhoid, rectal cancer  Lab work obtained.  No significant anemia that requires a blood transfusion.  Slow decrease when compared to prior lab work.  No significant electrolyte abnormalities.  Patient without symptomatic anemia.  Most likely having lower GI bleed secondary to a hemorrhoid.  No history of IBD and no family history.  Discussed close follow-up with his primary care physician and reevaluation with gastroenterology for ongoing symptoms for repeat colonoscopy and possible endoscopy.  Do not feel that starting on a PPI is necessary at this time given that he has no symptoms of gastritis.  Given return precautions to the emergency department for any worsening bleeding or symptoms of anemia.  No questions at time of discharge.  Patient discharged home in stable condition.   Patient's presentation is most consistent with acute presentation with potential threat to life or bodily function.       FINAL CLINICAL IMPRESSION(S) / ED  DIAGNOSES   Final diagnoses:  Rectal bleeding  Hemorrhoids, unspecified hemorrhoid type     Rx / DC Orders   ED Discharge Orders     None        Note:  This document was prepared using Dragon voice recognition software and may include unintentional dictation errors.   Nathaniel Man, MD 06/19/22 [phone]

## 2022-06-19 NOTE — ED Triage Notes (Addendum)
Pt comes with c/o blood in stool this am. Pt denies any belly pain. Pt denies any N/V/D. Pt states hx of blood clot in leg from last year and was ojn thinners but not now.   Pt states it was dark and clots present.

## 2022-07-16 ENCOUNTER — Other Ambulatory Visit: Payer: Self-pay | Admitting: Internal Medicine

## 2022-07-16 DIAGNOSIS — I5020 Unspecified systolic (congestive) heart failure: Secondary | ICD-10-CM

## 2022-07-24 ENCOUNTER — Other Ambulatory Visit (HOSPITAL_COMMUNITY): Payer: Self-pay | Admitting: *Deleted

## 2022-07-24 MED ORDER — METOPROLOL TARTRATE 100 MG PO TABS: ORAL_TABLET | ORAL | 0 refills | Status: DC

## 2022-07-24 MED ORDER — IVABRADINE HCL 7.5 MG PO TABS: ORAL_TABLET | ORAL | 0 refills | Status: DC

## 2022-07-25 ENCOUNTER — Telehealth (HOSPITAL_COMMUNITY): Payer: Self-pay | Admitting: *Deleted

## 2022-07-25 NOTE — Telephone Encounter (Signed)
Attempted to call patient regarding upcoming cardiac CT appointment. °Left message on voicemail with name and callback number ° °Clancy Leiner RN Navigator Cardiac Imaging °[facility] °[phone] Office °[phone] Cell ° °

## 2022-07-25 NOTE — Telephone Encounter (Signed)
Patient returning call regarding upcoming cardiac imaging study; pt verbalizes understanding of appt date/time, parking situation and where to check in, medications ordered, and verified current allergies; name and call back number provided for further questions should they arise  Micheal Clement RN Navigator Cardiac Imaging [name] [phone] office [phone] cell  Patient to take '100mg'$  metoprolol tartrate and '15mg'$  ivabradine two hours prior to his cardiac CT scan.

## 2022-07-26 ENCOUNTER — Ambulatory Visit
Admission: RE | Admit: 2022-07-26 | Discharge: 2022-07-26 | Disposition: A | Discharge status: Home / Self Care | Payer: No Typology Code available for payment source | Source: Ambulatory Visit | Attending: Internal Medicine | Admitting: Internal Medicine

## 2022-07-26 DIAGNOSIS — I5020 Unspecified systolic (congestive) heart failure: Secondary | ICD-10-CM | POA: Insufficient documentation

## 2022-07-26 MED ORDER — IOHEXOL 350 MG/ML SOLN
75.0000 mL | Freq: Once | INTRAVENOUS | Status: AC | PRN
  Administered 2022-07-26: 75 mL via INTRAVENOUS

## 2022-07-26 MED ORDER — NITROGLYCERIN 0.4 MG SL SUBL
0.8000 mg | SUBLINGUAL_TABLET | Freq: Once | SUBLINGUAL | Status: AC
  Administered 2022-07-26: 0.8 mg via SUBLINGUAL

## 2022-07-26 NOTE — Progress Notes (Signed)
Patient tolerated procedure well. Ambulate w/o difficulty. Denies any lightheadedness or being dizzy. Pt denies any pain at this time. Sitting in chair, pt is encouraged to drink additional water throughout the day and reason explained to patient. Patient verbalized understanding and all questions answered. ABC intact. No further needs at this time. Discharge from procedure area w/o issues.  

## 2022-11-19 ENCOUNTER — Ambulatory Visit
Admission: EM | Admit: 2022-11-19 | Discharge: 2022-11-19 | Disposition: A | Discharge status: Home / Self Care | Payer: BC Managed Care – PPO | Attending: Family Medicine | Admitting: Family Medicine

## 2022-11-19 DIAGNOSIS — K047 Periapical abscess without sinus: Secondary | ICD-10-CM

## 2022-11-19 DIAGNOSIS — K0889 Other specified disorders of teeth and supporting structures: Secondary | ICD-10-CM

## 2022-11-19 MED ORDER — PENICILLIN V POTASSIUM 500 MG PO TABS: 500.0000 mg | ORAL_TABLET | Freq: Four times a day (QID) | ORAL | 0 refills | Status: AC

## 2022-11-19 MED ORDER — NAPROXEN 500 MG PO TABS: 500.0000 mg | ORAL_TABLET | Freq: Two times a day (BID) | ORAL | 0 refills | Status: DC

## 2022-11-19 MED ORDER — TRAMADOL HCL 50 MG PO TABS
50.0000 mg | ORAL_TABLET | Freq: Four times a day (QID) | ORAL | 0 refills | Status: DC | PRN
Start: 2022-11-19 — End: 2022-12-11

## 2022-11-19 NOTE — Discharge Instructions (Addendum)
At this time there is no abscess to be drained. You were prescribed an antibiotic. Please take this exactly as directed and do not stop taking it until the entire course of medicine is finished, even if you begin to feel better before finishing the course. We have also given you pain medication; please take this only as prescribed for severe pain, and do not drink or drive while taking this medication. Follow up with your primary care provider as needed.  Schedule an appointment with your dentist or call local dentists to see if they take your insurance or can do a payment payment plan.  [place] and [place] of dentistry are options. Consider [facility] prior to your dental appointment.
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
## 2022-11-19 NOTE — ED Triage Notes (Signed)
Pt reports dental pain for several weeks.

## 2022-12-11 ENCOUNTER — Ambulatory Visit (INDEPENDENT_AMBULATORY_CARE_PROVIDER_SITE_OTHER): Payer: BC Managed Care – PPO

## 2022-12-11 ENCOUNTER — Other Ambulatory Visit: Payer: Self-pay

## 2022-12-11 ENCOUNTER — Ambulatory Visit
Admission: EM | Admit: 2022-12-11 | Discharge: 2022-12-11 | Disposition: A | Discharge status: Home / Self Care | Payer: BC Managed Care – PPO | Attending: Physician Assistant | Admitting: Physician Assistant

## 2022-12-11 DIAGNOSIS — R0789 Other chest pain: Secondary | ICD-10-CM

## 2022-12-11 DIAGNOSIS — S46912A Strain of unspecified muscle, fascia and tendon at shoulder and upper arm level, left arm, initial encounter: Secondary | ICD-10-CM | POA: Diagnosis not present

## 2022-12-11 DIAGNOSIS — M25512 Pain in left shoulder: Secondary | ICD-10-CM | POA: Insufficient documentation

## 2022-12-11 LAB — TROPONIN I (HIGH SENSITIVITY): Troponin I (High Sensitivity): <2 ng/L (ref <18)

## 2022-12-11 MED ORDER — KETOROLAC TROMETHAMINE 60 MG/2ML IM SOLN
30.0000 mg | Freq: Once | INTRAMUSCULAR | Status: AC
  Administered 2022-12-11: 30 mg via INTRAMUSCULAR

## 2022-12-11 MED ORDER — BACLOFEN 10 MG PO TABS: 10.0000 mg | ORAL_TABLET | Freq: Three times a day (TID) | ORAL | 0 refills | Status: DC | PRN

## 2022-12-11 MED ORDER — TRAMADOL HCL 50 MG PO TABS: 50.0000 mg | ORAL_TABLET | Freq: Four times a day (QID) | ORAL | 0 refills | Status: AC | PRN

## 2022-12-11 MED ORDER — NAPROXEN 500 MG PO TABS: 500.0000 mg | ORAL_TABLET | Freq: Two times a day (BID) | ORAL | 0 refills | Status: DC | PRN
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
## 2022-12-11 NOTE — ED Triage Notes (Signed)
Pt c/o left sided Chest pain that radiates to back pain started on Sunday. Describes as pressure and squeezing denies injury but does have a physically demanding job.

## 2022-12-11 NOTE — Discharge Instructions (Signed)
PAIN: Stressed avoiding painful activities . RICE (REST, ICE, COMPRESSION, ELEVATION) guidelines reviewed. May alternate ice and heat. Consider use of muscle rubs, Salonpas patches, etc. Use medications as directed including muscle relaxers if prescribed. Take anti-inflammatory medications as prescribed or OTC NSAIDs/Tylenol.  F/u with PCP in 7-10 days for reexamination, and please feel free to call or return to the urgent care at any time for any questions or concerns you may have and we will be happy to help you!   BACK PAIN RED FLAGS: If the back pain acutely worsens or there are any red flag symptoms such as numbness/tingling, leg weakness, saddle anesthesia, or loss of bowel/bladder control, go immediately to the ER. Follow up with [place] as scheduled or sooner if the pain does not begin to resolve or if it worsens before the follow up.  If you have increased pain in her chest, shortness of breath, weakness, nausea, dizziness,: 1 or go to ER.

## 2022-12-17 ENCOUNTER — Ambulatory Visit
Admission: EM | Admit: 2022-12-17 | Discharge: 2022-12-17 | Disposition: A | Discharge status: Home / Self Care | Payer: BC Managed Care – PPO | Attending: Emergency Medicine | Admitting: Emergency Medicine

## 2022-12-17 DIAGNOSIS — M545 Low back pain, unspecified: Secondary | ICD-10-CM

## 2022-12-17 MED ORDER — NAPROXEN 500 MG PO TABS: 500.0000 mg | ORAL_TABLET | Freq: Two times a day (BID) | ORAL | 0 refills | Status: DC | PRN

## 2022-12-17 MED ORDER — BACLOFEN 10 MG PO TABS: 10.0000 mg | ORAL_TABLET | Freq: Three times a day (TID) | ORAL | 0 refills | Status: DC | PRN
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
## 2022-12-17 NOTE — Discharge Instructions (Addendum)
Take the Naprosyn, 500 mg every 12 hours with food, on a schedule for the next 48 hours and then as needed.  Take the baclofen, 100 mg every 8 hours, on a schedule for the next 48 hours and then as needed.  Apply moist heat to your back for 30 minutes at a time 2-3 times a day to improve blood flow to the area and help remove the lactic acid causing the spasm.  Follow the back exercises given at discharge.  Return for reevaluation for any new or worsening symptoms. IF your pain continues I recommend following up with [facility] in [place].

## 2023-07-10 ENCOUNTER — Ambulatory Visit
Admission: EM | Admit: 2023-07-10 | Discharge: 2023-07-10 | Payer: BC Managed Care – PPO | Attending: Emergency Medicine | Admitting: Emergency Medicine

## 2023-07-10 ENCOUNTER — Emergency Department: Payer: Self-pay

## 2023-07-10 ENCOUNTER — Other Ambulatory Visit: Payer: Self-pay

## 2023-07-10 ENCOUNTER — Emergency Department
Admission: EM | Admit: 2023-07-10 | Discharge: 2023-07-10 | Discharge status: Against Medical Advice | Payer: Self-pay | Attending: Emergency Medicine | Admitting: Emergency Medicine

## 2023-07-10 ENCOUNTER — Encounter: Payer: Self-pay | Admitting: Emergency Medicine

## 2023-07-10 DIAGNOSIS — M79661 Pain in right lower leg: Secondary | ICD-10-CM

## 2023-07-10 DIAGNOSIS — R079 Chest pain, unspecified: Secondary | ICD-10-CM

## 2023-07-10 DIAGNOSIS — Z5321 Procedure and treatment not carried out due to patient leaving prior to being seen by health care provider: Secondary | ICD-10-CM | POA: Insufficient documentation

## 2023-07-10 LAB — BASIC METABOLIC PANEL
Anion gap: 9 (ref 5–15)
BUN: 14 mg/dL (ref 6–20)
CO2: 24 mmol/L (ref 22–32)
Calcium: 9.5 mg/dL (ref 8.9–10.3)
Chloride: 103 mmol/L (ref 98–111)
Creatinine, Ser: 0.95 mg/dL (ref 0.61–1.24)
GFR, Estimated: >60 mL/min (ref >60)
Glucose, Bld: 113 mg/dL — ABNORMAL HIGH (ref 70–99)
Potassium: 3.6 mmol/L (ref 3.5–5.1)
Sodium: 136 mmol/L (ref 135–145)

## 2023-07-10 LAB — CBC
HCT: 38.2 % — ABNORMAL LOW (ref 39.0–52.0)
Hemoglobin: 12.4 g/dL — ABNORMAL LOW (ref 13.0–17.0)
MCH: 26.1 pg (ref 26.0–34.0)
MCHC: 32.5 g/dL (ref 30.0–36.0)
MCV: 80.3 fL (ref 80.0–100.0)
Platelets: 206 10*3/uL (ref 150–400)
RBC: 4.76 MIL/uL (ref 4.22–5.81)
RDW: 15.9 % — ABNORMAL HIGH (ref 11.5–15.5)
WBC: 4.4 10*3/uL (ref 4.0–10.5)
nRBC: 0 % (ref 0.0–0.2)

## 2023-07-10 LAB — TROPONIN I (HIGH SENSITIVITY): Troponin I (High Sensitivity): 2 ng/L (ref <18)

## 2023-07-10 LAB — D-DIMER, QUANTITATIVE: D-Dimer, Quant: 0.44 ug{FEU}/mL (ref 0.00–0.50)

## 2023-07-10 NOTE — ED Triage Notes (Signed)
Pt c/o chest pain and lower right leg pain. Pt has hx dvt, not currently taking blood thinners. Pt denies shortness of breath, states pain in chest is sharp with no accompanying symptoms.
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
## 2023-07-10 NOTE — ED Notes (Signed)
Patient is being discharged from the Urgent Care and sent to the Emergency Department via personal vehicle . Per Berneta Sages NP, patient is in need of higher level of care due to chest pain and r/o DVT right calf. Patient is aware and verbalizes understanding of plan of care.  Vitals:   07/10/23 1856  BP: 126/84  Pulse: 80  Resp: 18  Temp: 98.1 F (36.7 C)  SpO2: 97%

## 2023-07-10 NOTE — ED Triage Notes (Signed)
Pt c/o left side chest pressure that started last night. Pt denies any SOB or cold symptoms. He also has right calf pain x 2 days.

## 2023-07-10 NOTE — Discharge Instructions (Addendum)
Please go to [facility] to have your chest pain and right calf pain evaluated to rule out cardiac injury or blood clot.  Please go now.

## 2023-07-29 ENCOUNTER — Other Ambulatory Visit: Payer: Self-pay | Admitting: Family Medicine

## 2023-07-29 DIAGNOSIS — K7689 Other specified diseases of liver: Secondary | ICD-10-CM

## 2023-07-31 ENCOUNTER — Inpatient Hospital Stay: Payer: Self-pay | Admitting: Oncology

## 2023-07-31 ENCOUNTER — Inpatient Hospital Stay: Payer: Self-pay

## 2023-10-07 ENCOUNTER — Other Ambulatory Visit: Payer: Self-pay

## 2023-10-07 ENCOUNTER — Emergency Department: Payer: Medicaid Other

## 2023-10-07 ENCOUNTER — Emergency Department
Admission: EM | Admit: 2023-10-07 | Discharge: 2023-10-07 | Disposition: A | Discharge status: Home / Self Care | Payer: Medicaid Other | Attending: Emergency Medicine | Admitting: Emergency Medicine

## 2023-10-07 DIAGNOSIS — I82561 Chronic embolism and thrombosis of right calf muscular vein: Secondary | ICD-10-CM | POA: Insufficient documentation

## 2023-10-07 DIAGNOSIS — I2699 Other pulmonary embolism without acute cor pulmonale: Secondary | ICD-10-CM | POA: Diagnosis not present

## 2023-10-07 DIAGNOSIS — M79661 Pain in right lower leg: Secondary | ICD-10-CM | POA: Diagnosis present

## 2023-10-07 HISTORY — DX: Acute embolism and thrombosis of unspecified axillary vein: I82.A19

## 2023-10-07 HISTORY — DX: Heart failure, unspecified: I50.9

## 2023-10-07 LAB — BASIC METABOLIC PANEL
Anion gap: 9 (ref 5–15)
BUN: 12 mg/dL (ref 6–20)
CO2: 25 mmol/L (ref 22–32)
Calcium: 9.2 mg/dL (ref 8.9–10.3)
Chloride: 105 mmol/L (ref 98–111)
Creatinine, Ser: 0.87 mg/dL (ref 0.61–1.24)
GFR, Estimated: >60 mL/min (ref >60)
Glucose, Bld: 92 mg/dL (ref 70–99)
Potassium: 4 mmol/L (ref 3.5–5.1)
Sodium: 139 mmol/L (ref 135–145)

## 2023-10-07 LAB — BRAIN NATRIURETIC PEPTIDE: B Natriuretic Peptide: 11.9 pg/mL (ref 0.0–100.0)

## 2023-10-07 LAB — TROPONIN I (HIGH SENSITIVITY)
Troponin I (High Sensitivity): <2 ng/L (ref <18)
Troponin I (High Sensitivity): <2 ng/L (ref <18)

## 2023-10-07 MED ORDER — DABIGATRAN ETEXILATE MESYLATE 150 MG PO CAPS: 150.0000 mg | ORAL_CAPSULE | Freq: Two times a day (BID) | ORAL | 0 refills | Status: DC

## 2023-10-07 MED ORDER — APIXABAN 5 MG PO TABS
10.0000 mg | ORAL_TABLET | Freq: Once | ORAL | Status: AC
  Administered 2023-10-07: 10 mg via ORAL
  Filled 2023-10-07: qty 2

## 2023-10-07 MED ORDER — APIXABAN (ELIQUIS) VTE STARTER PACK (10MG AND 5MG)
ORAL_TABLET | ORAL | 0 refills | Status: DC
  Filled 2023-10-07: qty 74, fill #0
  Filled 2023-10-11: qty 74, 30d supply, fill #0

## 2023-10-07 MED ORDER — APIXABAN (ELIQUIS) VTE STARTER PACK (10MG AND 5MG)
ORAL_TABLET | ORAL | 0 refills | Status: DC
  Filled 2023-10-07: qty 74, 28d supply, fill #0

## 2023-10-07 MED ORDER — IOHEXOL 350 MG/ML SOLN
75.0000 mL | Freq: Once | INTRAVENOUS | Status: AC | PRN
  Administered 2023-10-07: 75 mL via INTRAVENOUS

## 2023-10-07 NOTE — ED Provider Triage Note (Signed)
Emergency Medicine Provider Triage Evaluation Note  Micheal Maxwell , a 53 y.o. male  was evaluated in triage.  Pt complains of right leg pain. Diagnosed with DVT, has not been taking blood thinners since he does not have insurance. Reports leg pain is worse. Reports CP x5 days.  Review of Systems  Positive: CP, leg pain Negative: SOB  Physical Exam  There were no vitals taken for this visit. Gen:   Awake, no distress   Resp:  Normal effort  MSK:   Moves extremities without difficulty Other:    Medical Decision Making  Medically screening exam initiated at 2:32 PM.  Appropriate orders placed.  Micheal Maxwell was informed that the remainder of the evaluation will be completed by another provider, this initial triage assessment does not replace that evaluation, and the importance of remaining in the ED until their evaluation is complete.     Micheal Hoehn, PA-C 10/07/23 1434

## 2023-10-07 NOTE — ED Triage Notes (Signed)
Pt sts that he has been having right leg pain due to a blood clot. Pt sts that he was on eliquis for the clot however she ran out about a month ago and has not been taking it.

## 2023-10-07 NOTE — ED Provider Notes (Signed)
[facility] Provider Note   Event Date/Time   First MD Initiated Contact with Patient 10/07/23 1555     (approximate) History  Leg Pain  HPI Micheal Maxwell is a 53 y.o. male with stated past medical history of right lower extremity DVT who presents complaining of worsening right lower extremity pain after he has been out of his Eliquis for the last month.  Patient states that he had insurance issues that kept him from refilling this medication and since that time he has had worsening pain in this right leg.  Patient also endorses intermittent dyspnea on exertion that has been present over the last 2 weeks. ROS: Patient currently denies any vision changes, tinnitus, difficulty speaking, facial droop, sore throat, chest pain, abdominal pain, nausea/vomiting/diarrhea, dysuria, or weakness/numbness/paresthesias in any extremity   Physical Exam  Triage Vital Signs: ED Triage Vitals  Encounter Vitals Group     BP 10/07/23 1436 (!) 134/107     Systolic BP Percentile --      Diastolic BP Percentile --      Pulse Rate 10/07/23 1436 71     Resp 10/07/23 1436 18     Temp 10/07/23 1436 97.8 F (36.6 C)     Temp Source 10/07/23 1436 Oral     SpO2 10/07/23 1436 99 %     Weight 10/07/23 1433 179 lb (81.2 kg)     Height 10/07/23 1433 5\' 6"  (1.676 m)     Head Circumference --      Peak Flow --      Pain Score 10/07/23 1433 7     Pain Loc --      Pain Education --      Exclude from Growth Chart --    Most recent vital signs: Vitals:   10/07/23 1436 10/07/23 1916  BP: (!) 134/107 (!) 127/92  Pulse: 71 62  Resp: 18 18  Temp: 97.8 F (36.6 C)   SpO2: 99% 97%   General: Awake, oriented x4. CV:  Good peripheral perfusion.  Resp:  Normal effort.  Abd:  No distention.  Other:  Middle-aged well-developed, well-nourished African-American male resting comfortably in no acute distress.  Mild right calf tenderness to palpation ED Results / Procedures / Treatments   Labs (all labs ordered are listed, but only abnormal results are displayed) Labs Reviewed  BRAIN NATRIURETIC PEPTIDE  BASIC METABOLIC PANEL  TROPONIN I (HIGH SENSITIVITY)  TROPONIN I (HIGH SENSITIVITY)   EKG ED ECG REPORT I, Merwyn Katos, the attending physician, personally viewed and interpreted this ECG. Date: 10/07/2023 EKG Time: 1437 Rate: 64 Rhythm: normal sinus rhythm QRS Axis: normal Intervals: normal ST/T Wave abnormalities: normal Narrative Interpretation: no evidence of acute ischemia RADIOLOGY ED MD interpretation: Doppler ultrasound of the right lower extremity shows thrombus in the distal aspect of the right popliteal vein with poor flow -Agree with radiology assessment Official radiology report(s): CT Angio Chest PE W/Cm &/Or Wo Cm Addendum Date: 10/07/2023 ADDENDUM REPORT: 10/07/2023 18:58 ADDENDUM: The original report was by Dr. Gaylyn Rong. The following addendum is by Dr. Gaylyn Rong: Critical Value/emergent results were called by telephone at the time of interpretation on 10/07/2023 at 6:34 pm to provider [facility] , who verbally acknowledged these results. Electronically Signed   By: Gaylyn Rong M.D.   On: 10/07/2023 18:58   Result Date: 10/07/2023 CLINICAL DATA:  Right leg pain, DVT.  Chest pain. EXAM: CT ANGIOGRAPHY CHEST WITH CONTRAST TECHNIQUE: Multidetector CT imaging of the  chest was performed using the standard protocol during bolus administration of intravenous contrast. Multiplanar CT image reconstructions and MIPs were obtained to evaluate the vascular anatomy. RADIATION DOSE REDUCTION: This exam was performed according to the departmental dose-optimization program which includes automated exposure control, adjustment of the mA and/or kV according to patient size and/or use of iterative reconstruction technique. CONTRAST:  75mL OMNIPAQUE IOHEXOL 350 MG/ML SOLN COMPARISON:  10/07/2023 lower extremity ultrasound; cardiac CT from 07/26/2022  FINDINGS: Cardiovascular: Acute segmental pulmonary embolus is present in the right upper lobe on image 119 series 6. Clot burden is small. Right ventricular to left ventricular ratio 0.77, not elevated. Mediastinum/Nodes: Unremarkable Lungs/Pleura: Mild clustered bulla at the right lung apex. Upper Abdomen: Lateral segment left hepatic lobe hemangioma bulges into the falciform ligament as shown on 05/30/2017 examination. Small right hepatic lobe hemangioma on image 122 series 5 also similar to the 05/30/2017 exam. Mild atheromatous vascular calcification of the lower abdominal aorta. Musculoskeletal: Unremarkable Review of the MIP images confirms the above findings. IMPRESSION: 1. Acute segmental pulmonary embolus in the right upper lobe. Clot burden is small. No elevated right ventricular to left ventricular ratio. 2. Mild clustered bulla at the right lung apex. 3. Hepatic hemangiomas. 4. Mild atheromatous vascular calcification of the lower abdominal aorta. Aortic Atherosclerosis (ICD10-I70.0). Radiology assistant personnel have been notified to put me in telephone contact with the referring physician or the referring physician's clinical representative in order to discuss these findings. Once this communication is established I will issue an addendum to this report for documentation purposes. Electronically Signed: By: Gaylyn Rong M.D. On: 10/07/2023 18:26   US Venous Img Lower Right (DVT Study) Result Date: 10/07/2023 CLINICAL DATA:  Right lower extremity pain and history of prior DVT. EXAM: RIGHT LOWER EXTREMITY VENOUS DOPPLER ULTRASOUND TECHNIQUE: Gray-scale sonography with graded compression, as well as color Doppler and duplex ultrasound were performed to evaluate the lower extremity deep venous systems from the level of the common femoral vein and including the common femoral, femoral, profunda femoral, popliteal and calf veins including the posterior tibial, peroneal and gastrocnemius veins when  visible. The superficial great saphenous vein was also interrogated. Spectral Doppler was utilized to evaluate flow at rest and with distal augmentation maneuvers in the common femoral, femoral and popliteal veins. COMPARISON:  No prior right lower extremity studies for comparison. FINDINGS: Contralateral Common Femoral Vein: Respiratory phasicity is normal and symmetric with the symptomatic side. No evidence of thrombus. Normal compressibility. Common Femoral Vein: No evidence of thrombus. Normal compressibility, respiratory phasicity and response to augmentation. Saphenofemoral Junction: No evidence of thrombus. Normal compressibility and flow on color Doppler imaging. Profunda Femoral Vein: No evidence of thrombus. Normal compressibility and flow on color Doppler imaging. Femoral Vein: No evidence of thrombus. Normal compressibility, respiratory phasicity and response to augmentation. Popliteal Vein: Thrombus identified in the distal aspect of the right popliteal vein with poor flow. Some of this thrombus does appear echogenic and there likely is a chronic component. Calf Veins: No evidence of thrombus. Normal compressibility and flow on color Doppler imaging. Superficial Great Saphenous Vein: No evidence of thrombus. Normal compressibility. Venous Reflux:  None. Other Findings: No evidence of superficial thrombophlebitis or abnormal fluid collection. IMPRESSION: Thrombus in the distal aspect of the right popliteal vein with poor flow. Some of this thrombus does appear echogenic and there likely is a chronic component. Electronically Signed   By: Irish Lack M.D.   On: 10/07/2023 17:04   PROCEDURES: Critical Care performed: Yes,  see critical care procedure note(s) Procedures MEDICATIONS ORDERED IN ED: Medications  apixaban (ELIQUIS) tablet 10 mg (10 mg Oral Given 10/07/23 1724)  iohexol (OMNIPAQUE) 350 MG/ML injection 75 mL (75 mLs Intravenous Contrast Given 10/07/23 1753)   IMPRESSION / MDM /  ASSESSMENT AND PLAN / ED COURSE  I reviewed the triage vital signs and the nursing notes.                             The patient is on the cardiac monitor to evaluate for evidence of arrhythmia and/or significant heart rate changes. Patient's presentation is most consistent with acute presentation with potential threat to life or bodily function. Presents with unilateral leg swelling and pain Nontoxic appearing, VSS. No lymphangitic spread visible. No fluid pockets or fluctuance concerning for abscess noted. Low concern for cellulitis or osteomyelitis. No evidence of phlegmasia cerulea or alba dolens. Focal and unilateral nature not consistent with heart failure.  Workup US Venous Doppler Lower Extremity showing calf DVT CTA chest showing single segmental PE without evidence of heart strain Rx: Given patient's financial constraints, he will be started on Pradaxa as it is one of the cheapest options for anticoagulation.  I understand that this is not ideal as he has not had 5 days of parenteral anticoagulation however this is better than no anticoagulation. Patient will also be given Eliquis starter pack in the anticipation that he will be able to enroll in medication management as a social work consult was placed prior to his discharge  Disposition: Discharge with appropriate follow up Instructed patient to follow up with primary care physician in next 48 hours as well.   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   10/07/23 1831    dabigatran (PRADAXA) 150 MG CAPS capsule  2 times daily        10/07/23 1907    APIXABAN (ELIQUIS) VTE STARTER PACK (10MG  AND 5MG )       Note to Pharmacy: If starter pack unavailable, substitute with seventy-four 5 mg apixaban tabs following the above SIG directions.   10/07/23 1908           Note:  This document was prepared using Dragon voice recognition software and may include unintentional dictation errors.   Merwyn Katos, MD 10/07/23 [phone]

## 2023-10-08 ENCOUNTER — Telehealth: Payer: Self-pay

## 2023-10-08 ENCOUNTER — Other Ambulatory Visit: Payer: Self-pay

## 2023-10-08 NOTE — Progress Notes (Signed)
Care Guide Pharmacy Note  10/08/2023 Name: Micheal Maxwell MRN: [id number] DOB: May 07, 1971  Referred By: Wilford Corner, PA-C Reason for referral: Care Coordination (Outreach to schedule with pharm d)   Micheal Maxwell is a 53 y.o. year old male who is a primary care patient of Wilford Corner, PA-C.  Micheal Maxwell was referred to the pharmacist for assistance related to:  DVT  Successful contact was made with the patient to discuss pharmacy services including being ready for the pharmacist to call at least 5 minutes before the scheduled appointment time and to have medication bottles and any blood pressure readings ready for review. The patient agreed to meet with the pharmacist via telephone visit on (date/time).10/11/2023  Penne Lash , RMA     [place]  [facility], [facility] Guide  Direct Dial: [phone]  Website: [email or web address]

## 2023-10-11 ENCOUNTER — Other Ambulatory Visit: Payer: Self-pay

## 2023-10-11 ENCOUNTER — Telehealth: Payer: Self-pay

## 2023-10-11 DIAGNOSIS — Z5986 Financial insecurity: Secondary | ICD-10-CM

## 2023-10-11 NOTE — Progress Notes (Signed)
   10/11/2023  Patient ID: Micheal Maxwell, male   DOB: 08/08/1971, 53 y.o.   MRN: [id number]  Reached out to patient via telephone to discuss Eliquis cost concerns and coordinate patient assistance.   Patient confirms he does not have prescription insurance coverage; therefore, would qualify to receive Eliquis from manufacturer [employer] at no charge. Would need PCP to sign the provider portion of that application.  Patient confirms PCP is Debbra Riding, PA-C at [facility]. Reached out to current pharmacist embedded there and she will help coordinate getting patient's application completed and submitted.  Made patient aware that the pharmacy team at that office would be reaching out to coordinate getting him to complete his portion of the application.  Did contact outpatient pharmacy at [facility] and confirm they do have steps in place to provide patient with the Eliquis while this application process is pending.   Patient aware and expressed understanding. Confirms he does have his pradaxa and is taking with no concerns at this time.  Sherrill Raring, PharmD Clinical Pharmacist [phone]

## 2023-10-11 NOTE — Progress Notes (Signed)
   10/11/2023  Patient ID: Micheal Maxwell, male   DOB: May 14, 1971, 53 y.o.   MRN: [id number]  Reason for referral: Eliquis patient assistance  Referral source: Delano Metz, PharmD (recently contacted patient regarding patient assistance)  Current insurance:None  Confirmed address: [address] [place] [address]   PMHx: Patient with recent DVT and bridging anticoagulation with Pradaxa until he is able to receive assistance for Eliquis.      Objective: No Known Allergies  Medications Reviewed Today     Reviewed by Katha Cabal, [facility] (Pharmacist) on 10/11/23 at 1519  Med List Status: <None>   Medication Order Taking? Sig Documenting Provider Last Dose Status Informant  acetaminophen (TYLENOL) 325 MG tablet [id number] Yes Take 650 mg by mouth every 6 (six) hours as needed. [provider] Taking Active   APIXABAN Everlene Balls) VTE STARTER PACK (10MG  AND 5MG ) [id number] No Take as directed on package: start with two-5mg  tablets twice daily for 7 days. On day 8, switch to one-5mg  tablet twice daily.  Patient not taking: Reported on 10/11/2023   Merwyn Katos, MD Not Taking Active            Med Note Para March, [facility] R   Fri Oct 11, 2023  3:19 PM) Holding until he finished Dabigatran  APIXABAN Everlene Balls) VTE STARTER PACK (10MG  AND 5MG ) [id number] No Take as directed on package: start with two-5mg  tablets twice daily for 7 days. On day 8, switch to one-5mg  tablet twice daily.  Patient not taking: Reported on 10/11/2023   Merwyn Katos, MD Not Taking Active            Med Note Para March, [facility] R   Fri Oct 11, 2023  3:19 PM) Holding until he finished Dabigatran  dabigatran (PRADAXA) 150 MG CAPS capsule [id number] Yes Take 1 capsule (150 mg total) by mouth 2 (two) times daily for 7 days. Merwyn Katos, MD Taking Active   naproxen (NAPROSYN) 500 MG tablet [id number] Yes Take 1 tablet (500 mg total) by mouth 2 (two) times daily as needed. Becky Augusta, NP Taking Active      Discontinued 07/23/19 1336   tadalafil (CIALIS) 10 MG tablet [id number] Yes Take 10 mg by mouth daily as needed for erectile dysfunction. [provider] Taking Active            The patient states that he has been taking Pradaxa and has not missed any doses, except for one day when he was trying to establish a plan for getting the medication. He reports no unusual bruising or bleeding. The patient mentioned that once he completes his course of Dabigatran, the plan is to start Eliquis. I educated the patient on how to transition once he completes Dabigatran.  The patient confirmed that he does not currently have insurance, but he anticipates obtaining insurance soon.   Medication Assistance Findings:  Medication assistance needs identified: Eliquis     Plan: I will route patient assistance letter to [facility] pharmacy technician who will coordinate patient assistance program application process for medications listed above.  [facility] pharmacy technician will assist with obtaining all required documents from both patient and provider(s) and submit application(s) once completed.    Thank you for allowing pharmacy to be a part of this patient's care. Cephus Shelling, PharmD Clinical Pharmacist Cell: [phone]

## 2023-10-11 NOTE — Progress Notes (Signed)
   10/11/2023  Patient ID: Micheal Maxwell, male   DOB: [birth year], 53 y.o.   MRN: [id number]  Attempted to contact patient for medication management/review. Left HIPAA compliant message for patient to return my call at their convenience.   First attempt for patient outreach. Will follow up with patient in 2-3 business days.  Thank you for allowing pharmacy to be a part of this patient's care.  Cephus Shelling, PharmD Clinical Pharmacist Cell: [phone]

## 2023-10-15 ENCOUNTER — Telehealth: Payer: Self-pay | Admitting: Pharmacy Technician

## 2023-10-15 DIAGNOSIS — Z5986 Financial insecurity: Secondary | ICD-10-CM

## 2023-10-15 NOTE — Progress Notes (Addendum)
Pharmacy Medication Assistance Program Note    10/15/2023  Patient ID: Micheal Maxwell, male   DOB: 1970/12/31, 54 y.o.   MRN: [id number]     10/15/2023  Outreach Medication One  Initial Outreach Date (Medication One) 10/14/2023  Manufacturer Medication One [employer]  [employer] Eliquis  Dose of Eliquis 5mg   Type of Radiographer, therapeutic Assistance  Date Application Sent to Patient 10/16/2023  Application Items Requested Application;Proof of Income;Proof of Out of Pocket Spend;Other  Date Application Sent to Prescriber 10/16/2023  Name of Prescriber Debbra Riding   [facility] 10/30/2023 Successful outreach to patient in regard to Eliquis application with [employer]. HIPAA verified. Patient informs "he already got it straight" and when inquired if our help was needed he informed no. Will close the case but will gladly reopen the case if assistance is needed in the future.  Micheal Maxwell, CPhT [place]  Office: [phone] Fax: [phone] Email: [email or web address]

## 2023-11-04 ENCOUNTER — Telehealth: Payer: Self-pay | Admitting: Pharmacy Technician

## 2023-11-04 DIAGNOSIS — Z5986 Financial insecurity: Secondary | ICD-10-CM

## 2023-11-04 NOTE — Progress Notes (Signed)
 Pharmacy Medication Assistance Program Note    11/04/2023  Patient ID: Micheal Maxwell, male   DOB: 11-02-1970, 53 y.o.   MRN: [id number]     10/15/2023 11/04/2023  Outreach Medication One  Initial Outreach Date (Medication One) 10/14/2023   Manufacturer Medication One [employer]   [employer] Eliquis   Dose of Eliquis 5mg    Type of Radiographer, therapeutic Assistance   Date Application Sent to Patient 10/16/2023   Application Items Requested Application;Proof of Income;Proof of Out of Pocket Spend;Other   Date Application Sent to Prescriber 10/16/2023   Name of Prescriber Debbra Riding   Date Application Received From Patient  10/31/2023  Application Items Received From Patient  Application  Date Application Received From Provider  10/21/2023  Date Application Submitted to Manufacturer  11/04/2023  Method Application Sent to Manufacturer  Fax   Pattricia Boss, CPhT [place]  Office: [phone] Fax: [phone] Email: [email or web address]

## 2023-11-12 ENCOUNTER — Ambulatory Visit
Admission: EM | Admit: 2023-11-12 | Discharge: 2023-11-12 | Disposition: A | Discharge status: Home / Self Care | Payer: Medicaid Other | Attending: Physician Assistant | Admitting: Physician Assistant

## 2023-11-12 DIAGNOSIS — R5383 Other fatigue: Secondary | ICD-10-CM

## 2023-11-12 DIAGNOSIS — R051 Acute cough: Secondary | ICD-10-CM | POA: Diagnosis present

## 2023-11-12 DIAGNOSIS — R0981 Nasal congestion: Secondary | ICD-10-CM

## 2023-11-12 DIAGNOSIS — J101 Influenza due to other identified influenza virus with other respiratory manifestations: Secondary | ICD-10-CM

## 2023-11-12 LAB — RESP PANEL BY RT-PCR (FLU A&B, COVID) ARPGX2
Influenza A by PCR: POSITIVE — AB
Influenza B by PCR: NEGATIVE
SARS Coronavirus 2 by RT PCR: NEGATIVE

## 2023-11-12 MED ORDER — PROMETHAZINE-DM 6.25-15 MG/5ML PO SYRP: 5.0000 mL | ORAL_SOLUTION | Freq: Four times a day (QID) | ORAL | 0 refills | Status: DC | PRN

## 2023-11-12 MED ORDER — OSELTAMIVIR PHOSPHATE 75 MG PO CAPS
75.0000 mg | ORAL_CAPSULE | Freq: Two times a day (BID) | ORAL | 0 refills | Status: AC
Start: 2023-11-12 — End: 2023-11-17
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
## 2023-11-12 NOTE — ED Triage Notes (Signed)
 Pt is with his wife.   Pt c/o headaches, nasal congestion, diarrhea, cough x2days

## 2023-11-12 NOTE — Discharge Instructions (Signed)
-   Flu is positive.  You are within the window for treatment Tamiflu to potentially be helpful so I sent it to the pharmacy if you are positive. - Sent cough medicine and Tamiflu. - You need to isolate until you are fever free for 24 hours and symptoms are improving. - Increase rest and fluids. - You should be seen again if you have uncontrolled fever, weakness or worsening breathing problem.

## 2023-11-15 ENCOUNTER — Telehealth: Payer: Self-pay | Admitting: Pharmacy Technician

## 2023-11-15 DIAGNOSIS — Z5986 Financial insecurity: Secondary | ICD-10-CM

## 2023-11-15 NOTE — Progress Notes (Signed)
 Pharmacy Medication Assistance Program Note    11/15/2023  Patient ID: Micheal Maxwell, male   DOB: [birth year], 53 y.o.   MRN: [id number]     10/15/2023 11/04/2023 11/15/2023  Outreach Medication One  Initial Outreach Date ([employer]ication One) 10/14/2023    Manufacturer Medication One [employer]    [facility] Eliquis    Dose of Eliquis 5mg     Type of Radiographer, therapeutic Assistance    Date Application Sent to Patient 10/16/2023    Application Items Requested Application;Proof of Income;Proof of Out of Pocket Spend;Other    Date Application Sent to Prescriber 10/16/2023    Name of Prescriber Debbra Riding    Date Application Received From Patient  10/31/2023   Application Items Received From Patient  Application   Date Application Received From Provider  10/21/2023   Date Application Submitted to Manufacturer  11/04/2023   Method Application Sent to Manufacturer  Fax   Patient Assistance Determination   Approved  Approval Start Date   11/11/2023  Approval End Date   11/09/2024  Patient Notification Method   Telephone Call  Telephone Call Outcome   Successful    Care coordination call placed to [employer] in regard to Eliquis application. Spoke to Sophia who informs patient is APPROVED 11/11/23-11/09/24. She informs [facility] pharmacy [phone]) would be outreaching him within 3 days to set up shipment and then the delivery would commence in 3-5 business days. Sophia also informed refills are auto filled and shipped out automatically based on last time [facility] fills the medication.  Successful outreach to patient. HIPAA verified. Patient was informed of his approval, refill procedure and when to expect first shipment. Patient verbalized understanding.  Pattricia Boss, CPhT [facility]  Office: [phone] Fax: [phone] Email: [email or web address]

## 2023-12-10 ENCOUNTER — Inpatient Hospital Stay: Payer: Self-pay | Admitting: Oncology

## 2023-12-10 ENCOUNTER — Inpatient Hospital Stay: Payer: Self-pay

## 2023-12-17 ENCOUNTER — Inpatient Hospital Stay: Admitting: Oncology

## 2023-12-17 ENCOUNTER — Inpatient Hospital Stay

## 2023-12-18 ENCOUNTER — Ambulatory Visit
Admission: EM | Admit: 2023-12-18 | Discharge: 2023-12-18 | Disposition: A | Discharge status: Home / Self Care | Attending: Family Medicine | Admitting: Family Medicine

## 2023-12-18 DIAGNOSIS — M5412 Radiculopathy, cervical region: Secondary | ICD-10-CM

## 2023-12-18 MED ORDER — PREDNISONE 10 MG (21) PO TBPK: ORAL_TABLET | Freq: Every day | ORAL | 0 refills | Status: AC

## 2023-12-18 MED ORDER — CYCLOBENZAPRINE HCL 5 MG PO TABS
5.0000 mg | ORAL_TABLET | Freq: Three times a day (TID) | ORAL | 0 refills | Status: AC | PRN
Start: 2023-12-18 — End: ?

## 2023-12-18 MED ORDER — KETOROLAC TROMETHAMINE 60 MG/2ML IM SOLN
30.0000 mg | Freq: Once | INTRAMUSCULAR | Status: AC
  Administered 2023-12-18: 30 mg via INTRAMUSCULAR

## 2023-12-18 MED ORDER — NAPROXEN 500 MG PO TABS: 500.0000 mg | ORAL_TABLET | Freq: Two times a day (BID) | ORAL | 0 refills | Status: DC | PRN

## 2023-12-18 NOTE — Discharge Instructions (Addendum)
 If medication was prescribed, stop by the pharmacy to pick up your prescriptions.  For your  pain, Take 1500 mg Tylenol twice a day, take muscle relaxer (Flexeril), take Naprosyn twice a day,  as needed for pain.  Rest and elevate the affected painful area.  Apply warm compresses intermittently, as needed.  As pain recedes, begin normal activities slowly as tolerated.  Follow up with primary care provider or an orthopedic provider, if symptoms persist.  Watch for worsening symptoms such as an increasing weakness or loss of sensation, increasing pain and/or the loss of bladder or bowel function. Should any of these occur, go to the emergency department immediately.
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
## 2023-12-18 NOTE — ED Triage Notes (Signed)
 Pt reports he has right side neck pain that radiates down to the middle of his right arm x 2 weeks. States it makes it difficult for him to sleep.    States he also states he thinks he has a virus because he made BM on himself yesterday. States he couldn't hold it.

## 2023-12-19 ENCOUNTER — Telehealth: Payer: Self-pay | Admitting: Oncology

## 2023-12-19 NOTE — Telephone Encounter (Signed)
 Patient left a voicemail to reschedule his missed new patient appointment. Called patient back and rescheduled as requested.

## 2023-12-24 ENCOUNTER — Encounter: Payer: Self-pay | Admitting: Oncology

## 2023-12-24 ENCOUNTER — Inpatient Hospital Stay

## 2023-12-24 ENCOUNTER — Inpatient Hospital Stay: Attending: Oncology | Admitting: Oncology

## 2023-12-24 VITALS — BP 131/95 | HR 73 | Temp 97.0°F | Resp 19 | Ht 66.0 in | Wt 178.5 lb

## 2023-12-24 DIAGNOSIS — Z7902 Long term (current) use of antithrombotics/antiplatelets: Secondary | ICD-10-CM | POA: Diagnosis not present

## 2023-12-24 DIAGNOSIS — I509 Heart failure, unspecified: Secondary | ICD-10-CM | POA: Insufficient documentation

## 2023-12-24 DIAGNOSIS — K5900 Constipation, unspecified: Secondary | ICD-10-CM | POA: Insufficient documentation

## 2023-12-24 DIAGNOSIS — E538 Deficiency of other specified B group vitamins: Secondary | ICD-10-CM | POA: Insufficient documentation

## 2023-12-24 DIAGNOSIS — Z79899 Other long term (current) drug therapy: Secondary | ICD-10-CM | POA: Diagnosis not present

## 2023-12-24 DIAGNOSIS — I82403 Acute embolism and thrombosis of unspecified deep veins of lower extremity, bilateral: Secondary | ICD-10-CM

## 2023-12-24 DIAGNOSIS — Z803 Family history of malignant neoplasm of breast: Secondary | ICD-10-CM | POA: Insufficient documentation

## 2023-12-24 DIAGNOSIS — Z8041 Family history of malignant neoplasm of ovary: Secondary | ICD-10-CM | POA: Diagnosis not present

## 2023-12-24 DIAGNOSIS — Z86718 Personal history of other venous thrombosis and embolism: Secondary | ICD-10-CM | POA: Diagnosis present

## 2023-12-24 DIAGNOSIS — F17210 Nicotine dependence, cigarettes, uncomplicated: Secondary | ICD-10-CM | POA: Insufficient documentation

## 2023-12-24 DIAGNOSIS — Z7901 Long term (current) use of anticoagulants: Secondary | ICD-10-CM | POA: Insufficient documentation

## 2023-12-24 DIAGNOSIS — Z7952 Long term (current) use of systemic steroids: Secondary | ICD-10-CM | POA: Diagnosis not present

## 2023-12-24 DIAGNOSIS — D509 Iron deficiency anemia, unspecified: Secondary | ICD-10-CM | POA: Diagnosis not present

## 2023-12-24 LAB — IRON AND TIBC
Iron: 141 ug/dL (ref 45–182)
Saturation Ratios: 33 % (ref 17.9–39.5)
TIBC: 431 ug/dL (ref 250–450)
UIBC: 290 ug/dL

## 2023-12-24 LAB — CBC WITH DIFFERENTIAL/PLATELET
Abs Immature Granulocytes: 0.02 10*3/uL (ref 0.00–0.07)
Basophils Absolute: 0 10*3/uL (ref 0.0–0.1)
Basophils Relative: 1 %
Eosinophils Absolute: 0.2 10*3/uL (ref 0.0–0.5)
Eosinophils Relative: 3 %
HCT: 39.3 % (ref 39.0–52.0)
Hemoglobin: 12.7 g/dL — ABNORMAL LOW (ref 13.0–17.0)
Immature Granulocytes: 0 %
Lymphocytes Relative: 43 %
Lymphs Abs: 2.6 10*3/uL (ref 0.7–4.0)
MCH: 26.2 pg (ref 26.0–34.0)
MCHC: 32.3 g/dL (ref 30.0–36.0)
MCV: 81 fL (ref 80.0–100.0)
Monocytes Absolute: 0.4 10*3/uL (ref 0.1–1.0)
Monocytes Relative: 6 %
Neutro Abs: 2.8 10*3/uL (ref 1.7–7.7)
Neutrophils Relative %: 47 %
Platelets: 217 10*3/uL (ref 150–400)
RBC: 4.85 MIL/uL (ref 4.22–5.81)
RDW: 17.1 % — ABNORMAL HIGH (ref 11.5–15.5)
WBC: 6 10*3/uL (ref 4.0–10.5)
nRBC: 0 % (ref 0.0–0.2)

## 2023-12-24 LAB — FERRITIN: Ferritin: 128 ng/mL (ref 24–336)

## 2023-12-25 LAB — LUPUS ANTICOAGULANT
DRVVT: 35.5 s (ref 0.0–47.0)
PTT Lupus Anticoagulant: 27.8 s (ref 0.0–43.5)
Thrombin Time: 18.5 s (ref 0.0–23.0)
dPT Confirm Ratio: 1.31 ratio (ref 0.00–1.34)
dPT: 39.7 s (ref 0.0–47.6)

## 2023-12-25 LAB — HEX PHASE PHOSPHOLIPID REFLEX

## 2023-12-25 LAB — HEXAGONAL PHASE PHOSPHOLIPID: Hex Phosph Neut Test: 5 s (ref 0–11)

## 2023-12-29 ENCOUNTER — Encounter: Payer: Self-pay | Admitting: Oncology
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
## 2024-02-04 ENCOUNTER — Telehealth: Payer: Self-pay

## 2024-02-04 DIAGNOSIS — M542 Cervicalgia: Secondary | ICD-10-CM

## 2024-02-04 NOTE — Telephone Encounter (Signed)
 LVM for patient to go to the outpatient imaging to have xrays and the address was provided

## 2024-02-06 ENCOUNTER — Ambulatory Visit
Admission: RE | Admit: 2024-02-06 | Discharge: 2024-02-06 | Disposition: A | Discharge status: Home / Self Care | Attending: Orthopedic Surgery | Admitting: Orthopedic Surgery

## 2024-02-06 ENCOUNTER — Ambulatory Visit (INDEPENDENT_AMBULATORY_CARE_PROVIDER_SITE_OTHER): Admitting: Orthopedic Surgery

## 2024-02-06 ENCOUNTER — Ambulatory Visit
Admission: RE | Admit: 2024-02-06 | Discharge: 2024-02-06 | Disposition: A | Discharge status: Home / Self Care | Source: Ambulatory Visit | Attending: Orthopedic Surgery | Admitting: Orthopedic Surgery

## 2024-02-06 ENCOUNTER — Encounter: Payer: Self-pay | Admitting: Orthopedic Surgery

## 2024-02-06 VITALS — BP 145/83 | Ht 66.0 in | Wt 174.0 lb

## 2024-02-06 DIAGNOSIS — M542 Cervicalgia: Secondary | ICD-10-CM | POA: Diagnosis present

## 2024-02-06 NOTE — Patient Instructions (Signed)
 Recommend physical therapy for your neck
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
## 2024-03-06 ENCOUNTER — Ambulatory Visit: Attending: Orthopedic Surgery

## 2024-03-06 DIAGNOSIS — M542 Cervicalgia: Secondary | ICD-10-CM | POA: Diagnosis present

## 2024-03-06 DIAGNOSIS — M5412 Radiculopathy, cervical region: Secondary | ICD-10-CM | POA: Insufficient documentation
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
## 2024-03-09 ENCOUNTER — Ambulatory Visit

## 2024-03-09 DIAGNOSIS — M542 Cervicalgia: Secondary | ICD-10-CM | POA: Diagnosis not present

## 2024-03-09 DIAGNOSIS — M5412 Radiculopathy, cervical region: Secondary | ICD-10-CM
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
## 2024-03-11 ENCOUNTER — Ambulatory Visit

## 2024-03-16 ENCOUNTER — Ambulatory Visit

## 2024-03-16 DIAGNOSIS — M542 Cervicalgia: Secondary | ICD-10-CM | POA: Diagnosis not present

## 2024-03-16 DIAGNOSIS — M5412 Radiculopathy, cervical region: Secondary | ICD-10-CM
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
## 2024-03-18 ENCOUNTER — Ambulatory Visit: Attending: Orthopedic Surgery

## 2024-03-18 DIAGNOSIS — M542 Cervicalgia: Secondary | ICD-10-CM | POA: Diagnosis present

## 2024-03-18 DIAGNOSIS — M5412 Radiculopathy, cervical region: Secondary | ICD-10-CM | POA: Diagnosis present
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
## 2024-03-24 ENCOUNTER — Ambulatory Visit

## 2024-03-25 ENCOUNTER — Ambulatory Visit

## 2024-03-25 DIAGNOSIS — M542 Cervicalgia: Secondary | ICD-10-CM | POA: Diagnosis not present

## 2024-03-25 DIAGNOSIS — M5412 Radiculopathy, cervical region: Secondary | ICD-10-CM
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
## 2024-03-27 ENCOUNTER — Ambulatory Visit

## 2024-03-31 ENCOUNTER — Ambulatory Visit

## 2024-04-03 ENCOUNTER — Ambulatory Visit

## 2024-04-07 ENCOUNTER — Ambulatory Visit

## 2024-04-10 ENCOUNTER — Ambulatory Visit

## 2024-04-10 ENCOUNTER — Telehealth: Payer: Self-pay

## 2024-04-10 NOTE — Telephone Encounter (Signed)
 Called patient about missed PT appointment. No answer but LVM on next scheduled appointment. Encouraged patient to call for future rescheduling or canceling needs.

## 2024-04-14 ENCOUNTER — Ambulatory Visit

## 2024-04-14 ENCOUNTER — Telehealth: Payer: Self-pay

## 2024-04-14 NOTE — Telephone Encounter (Signed)
 Called patient about second missed PT appointment. Pt answered stating car trouble and reports he called but no shown VM left on PT office phone. Encouraged patient to call for cancellation and rescheduling needs. Reminded patient on no show policy if he has additional no shows. Patient aware of next scheduled appointment and plans to come to session.

## 2024-04-17 ENCOUNTER — Ambulatory Visit

## 2024-04-21 ENCOUNTER — Ambulatory Visit: Attending: Orthopedic Surgery

## 2024-04-21 DIAGNOSIS — M542 Cervicalgia: Secondary | ICD-10-CM | POA: Diagnosis present

## 2024-04-21 DIAGNOSIS — M5412 Radiculopathy, cervical region: Secondary | ICD-10-CM | POA: Insufficient documentation
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
## 2024-04-24 ENCOUNTER — Inpatient Hospital Stay: Attending: Oncology | Admitting: Oncology

## 2024-04-24 ENCOUNTER — Ambulatory Visit

## 2024-04-28 ENCOUNTER — Ambulatory Visit

## 2024-05-01 ENCOUNTER — Ambulatory Visit

## 2024-05-05 ENCOUNTER — Encounter

## 2024-05-08 ENCOUNTER — Encounter

## 2024-05-12 ENCOUNTER — Encounter

## 2024-05-15 ENCOUNTER — Encounter

## 2024-05-20 ENCOUNTER — Encounter

## 2024-05-22 ENCOUNTER — Encounter

## 2024-05-25 ENCOUNTER — Encounter

## 2024-06-24 ENCOUNTER — Ambulatory Visit: Admitting: Anesthesiology

## 2024-06-24 ENCOUNTER — Encounter: Payer: Self-pay | Admitting: Internal Medicine

## 2024-06-24 ENCOUNTER — Ambulatory Visit
Admission: RE | Admit: 2024-06-24 | Discharge: 2024-06-24 | Disposition: A | Discharge status: Home / Self Care | Attending: Internal Medicine | Admitting: Internal Medicine

## 2024-06-24 ENCOUNTER — Encounter
Admission: RE | Disposition: A | Discharge status: Home / Self Care | Payer: Self-pay | Source: Home / Self Care | Attending: Internal Medicine

## 2024-06-24 DIAGNOSIS — K640 First degree hemorrhoids: Secondary | ICD-10-CM | POA: Diagnosis not present

## 2024-06-24 DIAGNOSIS — Z1211 Encounter for screening for malignant neoplasm of colon: Secondary | ICD-10-CM | POA: Diagnosis present

## 2024-06-24 DIAGNOSIS — K5909 Other constipation: Secondary | ICD-10-CM | POA: Insufficient documentation

## 2024-06-24 DIAGNOSIS — I509 Heart failure, unspecified: Secondary | ICD-10-CM | POA: Insufficient documentation

## 2024-06-24 DIAGNOSIS — Z7901 Long term (current) use of anticoagulants: Secondary | ICD-10-CM | POA: Diagnosis not present

## 2024-06-24 DIAGNOSIS — Z86718 Personal history of other venous thrombosis and embolism: Secondary | ICD-10-CM | POA: Insufficient documentation

## 2024-06-24 DIAGNOSIS — R7303 Prediabetes: Secondary | ICD-10-CM | POA: Insufficient documentation

## 2024-06-24 DIAGNOSIS — N529 Male erectile dysfunction, unspecified: Secondary | ICD-10-CM | POA: Insufficient documentation

## 2024-06-24 DIAGNOSIS — N400 Enlarged prostate without lower urinary tract symptoms: Secondary | ICD-10-CM | POA: Diagnosis not present

## 2024-06-24 HISTORY — PX: COLONOSCOPY: SHX5424

## 2024-06-24 SURGERY — COLONOSCOPY
Anesthesia: General

## 2024-06-24 MED ORDER — SODIUM CHLORIDE 0.9 % IV SOLN: INTRAVENOUS | Status: DC

## 2024-06-24 MED ORDER — LIDOCAINE HCL (CARDIAC) PF 100 MG/5ML IV SOSY
PREFILLED_SYRINGE | INTRAVENOUS | Status: DC | PRN
  Administered 2024-06-24: 80 mg via INTRAVENOUS

## 2024-06-24 MED ORDER — DEXMEDETOMIDINE HCL IN NACL 80 MCG/20ML IV SOLN
INTRAVENOUS | Status: DC | PRN
  Administered 2024-06-24: 12 ug via INTRAVENOUS
  Administered 2024-06-24: 8 ug via INTRAVENOUS

## 2024-06-24 MED ORDER — PROPOFOL 500 MG/50ML IV EMUL
INTRAVENOUS | Status: DC | PRN
Start: 2024-06-24 — End: 2024-06-24
  Administered 2024-06-24: 75 ug/kg/min via INTRAVENOUS

## 2024-06-24 MED ORDER — PROPOFOL 10 MG/ML IV BOLUS
INTRAVENOUS | Status: DC | PRN
  Administered 2024-06-24 (×2): 50 mg via INTRAVENOUS

## 2024-06-24 NOTE — Op Note (Addendum)
 [facility] Gastroenterology Patient Name: Micheal Maxwell Procedure Date: 06/24/2024 1:18 PM MRN: [id number] Account #: [id number] Date of Birth: 01-22-1971 Admit Type: Outpatient Age: 53 Room: [facility] ENDO ROOM 2 Gender: Male Note Status: Supervisor Override Instrument Name: Colon Scope [id number] Procedure:             Colonoscopy Indications:           Hematochezia, chronic constipation Providers:             Tanis Hensarling K. Shemika Robbs MD, MD Medicines:             Propofol per Anesthesia Complications:         No immediate complications. Estimated blood loss: None. Procedure:             Pre-Anesthesia Assessment:                        - The risks and benefits of the procedure and the                         sedation options and risks were discussed with the                         patient. All questions were answered and informed                         consent was obtained.                        - Patient identification and proposed procedure were                         verified prior to the procedure by the nurse. The                         procedure was verified in the procedure room.                        - ASA Grade Assessment: II - A patient with mild                         systemic disease.                        - After reviewing the risks and benefits, the patient                         was deemed in satisfactory condition to undergo the                         procedure.                        After obtaining informed consent, the colonoscope was                         passed under direct vision. Throughout the procedure,                         the patient's blood pressure, pulse, and oxygen  saturations were monitored continuously. The                         Colonoscope was introduced through the anus and                         advanced to the the cecum, identified by appendiceal                         orifice and ileocecal valve.  The colonoscopy was                         performed without difficulty. The patient tolerated                         the procedure well. The quality of the bowel                         preparation was good. The ileocecal valve, appendiceal                         orifice, and rectum were photographed. Findings:      The perianal and digital rectal examinations were normal. Pertinent       negatives include normal sphincter tone and no palpable rectal lesions.      The colon (entire examined portion) appeared normal.      Non-bleeding internal hemorrhoids were found during retroflexion. The       hemorrhoids were Grade I (internal hemorrhoids that do not prolapse). Impression:            - The entire examined colon is normal.                        - Non-bleeding internal hemorrhoids.                        - No specimens collected. Recommendation:        - Patient has a contact number available for                         emergencies. The signs and symptoms of potential                         delayed complications were discussed with the patient.                         Return to normal activities tomorrow. Written                         discharge instructions were provided to the patient.                        - Resume previous diet.                        - Continue present medications.                        - Repeat colonoscopy in 10 years for screening  purposes.                        - Return to GI office PRN.                        - The findings and recommendations were discussed with                         the patient. Procedure Code(s):     --- Professional ---                        [phone], Colonoscopy, flexible; diagnostic, including                         collection of specimen(s) by brushing or washing, when                         performed (separate procedure) Diagnosis Code(s):     --- Professional ---                        K92.1,  Melena (includes Hematochezia)                        K64.0, First degree hemorrhoids CPT copyright 2022 American Medical Association. All rights reserved. The codes documented in this report are preliminary and upon coder review may  be revised to meet current compliance requirements. Ladell MARLA Boss MD, MD 06/24/2024 1:36:25 PM This report has been signed electronically. Number of Addenda: 0 Note Initiated On: 06/24/2024 1:18 PM Scope Withdrawal Time: 0 hours 6 minutes 5 seconds  Total Procedure Duration: 0 hours 9 minutes 1 second  Estimated Blood Loss:  Estimated blood loss: none. Estimated blood loss: none.      [facility]
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
## 2024-06-24 NOTE — Interval H&P Note (Signed)
 History and Physical Interval Note:  06/24/2024 12:40 PM  Micheal Maxwell  has presented today for surgery, with the diagnosis of Hematochezia [K92.1]  Chronic constipation [K59.09].  The various methods of treatment have been discussed with the patient and family. After consideration of risks, benefits and other options for treatment, the patient has consented to  Procedure(s) with comments: COLONOSCOPY (N/A) - Eliquis  as a surgical intervention.  The patient's history has been reviewed, patient examined, no change in status, stable for surgery.  I have reviewed the patient's chart and labs.  Questions were answered to the patient's satisfaction.     [place], [name]

## 2024-06-24 NOTE — H&P (Signed)
 Outpatient short stay form Pre-procedure 06/24/2024 12:38 PM Grant Henkes K. Aundria, M.D.  Primary Physician: Selinda Quan, PA-C  Reason for visit:  chronic constipation, colon cancer screening  History of present illness:  53 y/o male with a PMH of prediabetes, tobacco abuse, BPH, ED, and hx of recurrent DVT on chronic anticoagulation presents to the [facility] GI clinic for chief complaint of colon cancer screening and chronic constipation     Current Facility-Administered Medications:    0.9 %  sodium chloride infusion, , Intravenous, Continuous, [place], Ladell POUR, MD, Last Rate: 20 mL/hr at 06/24/24 1236, New Bag at 06/24/24 1236  Medications Prior to Admission  Medication Sig Dispense Refill Last Dose/Taking   apixaban  (ELIQUIS ) 5 MG TABS tablet Take 5 mg by mouth 2 (two) times daily.   06/20/2024   calcium carbonate (TUMS - DOSED IN MG ELEMENTAL CALCIUM) 500 MG chewable tablet Chew 1 tablet by mouth daily.   06/24/2024   cyclobenzaprine  (FLEXERIL ) 5 MG tablet Take 1 tablet (5 mg total) by mouth 3 (three) times daily as needed. 30 tablet 0    predniSONE  (STERAPRED UNI-PAK 21 TAB) 10 MG (21) TBPK tablet Take by mouth daily. Take 6 tabs by mouth daily for 1, then 5 tabs for 1 day, then 4 tabs for 1 day, then 3 tabs for 1 day, then 2 tabs for 1 day, then 1 tab for 1 day. 21 tablet 0    tadalafil (CIALIS) 10 MG tablet Take 10 mg by mouth daily as needed for erectile dysfunction.        No Known Allergies   Past Medical History:  Diagnosis Date   CHF (congestive heart failure) (HCC)    DVT of axillary vein, acute (HCC)     Review of systems:  Otherwise negative.    Physical Exam  Gen: Alert, oriented. Appears stated age.  HEENT: [place]/AT. PERRLA. Lungs: CTA, no wheezes. CV: RR nl S1, S2. Abd: soft, benign, no masses. BS+ Ext: No edema. Pulses 2+    Planned procedures: Proceed with colonoscopy. The patient understands the nature of the planned procedure, indications, risks,  alternatives and potential complications including but not limited to bleeding, infection, perforation, damage to internal organs and possible oversedation/side effects from anesthesia. The patient agrees and gives consent to proceed.  Please refer to procedure notes for findings, recommendations and patient disposition/instructions.     [place] Basham K. Aundria, M.D. Gastroenterology 06/24/2024  12:38 PM

## 2024-06-24 NOTE — Transfer of Care (Signed)
 Immediate Anesthesia Transfer of Care Note  Patient: Micheal Maxwell  Procedure(s) Performed: COLONOSCOPY  Patient Location: PACU  Anesthesia Type:General  Level of Consciousness: sedated  Airway & Oxygen Therapy: Patient Spontanous Breathing  Post-op Assessment: Report given to RN and Post -op Vital signs reviewed and stable  Post vital signs: Reviewed and stable  Last Vitals:  Vitals Value Taken Time  BP    Temp    Pulse    Resp    SpO2      Last Pain:  Vitals:   06/24/24 1228  TempSrc: Temporal         Complications: No notable events documented.

## 2024-06-30 NOTE — Anesthesia Postprocedure Evaluation (Signed)
 Anesthesia Post Note  Patient: Micheal Maxwell  Procedure(s) Performed: COLONOSCOPY  Anesthesia Type: General Anesthetic complications: no   No notable events documented.   Last Vitals:  Vitals:   06/24/24 1347 06/24/24 1357  BP: 92/61 107/79  Pulse: 77 71  Resp: 18 20  Temp:    SpO2: 95% 95%    Last Pain:  Vitals:   06/24/24 1337  TempSrc: Temporal                 VAN STAVEREN,Jermani Pund

## 2024-07-20 ENCOUNTER — Encounter: Payer: Self-pay | Admitting: Radiology

## 2024-08-18 ENCOUNTER — Emergency Department

## 2024-08-18 ENCOUNTER — Other Ambulatory Visit: Payer: Self-pay

## 2024-08-18 ENCOUNTER — Emergency Department
Admission: EM | Admit: 2024-08-18 | Discharge: 2024-08-18 | Disposition: A | Discharge status: Home / Self Care | Attending: Emergency Medicine | Admitting: Emergency Medicine

## 2024-08-18 ENCOUNTER — Encounter: Payer: Self-pay | Admitting: Emergency Medicine

## 2024-08-18 DIAGNOSIS — I509 Heart failure, unspecified: Secondary | ICD-10-CM | POA: Diagnosis not present

## 2024-08-18 DIAGNOSIS — R1032 Left lower quadrant pain: Secondary | ICD-10-CM | POA: Diagnosis present

## 2024-08-18 LAB — CBC
HCT: 40.9 % (ref 39.0–52.0)
Hemoglobin: 13.4 g/dL (ref 13.0–17.0)
MCH: 26.1 pg (ref 26.0–34.0)
MCHC: 32.8 g/dL (ref 30.0–36.0)
MCV: 79.6 fL — ABNORMAL LOW (ref 80.0–100.0)
Platelets: 221 K/uL (ref 150–400)
RBC: 5.14 MIL/uL (ref 4.22–5.81)
RDW: 16.6 % — ABNORMAL HIGH (ref 11.5–15.5)
WBC: 3.7 K/uL — ABNORMAL LOW (ref 4.0–10.5)
nRBC: 0 % (ref 0.0–0.2)

## 2024-08-18 LAB — COMPREHENSIVE METABOLIC PANEL WITH GFR
ALT: 17 U/L (ref 0–44)
AST: 22 U/L (ref 15–41)
Albumin: 4.4 g/dL (ref 3.5–5.0)
Alkaline Phosphatase: 76 U/L (ref 38–126)
Anion gap: 9 (ref 5–15)
BUN: 10 mg/dL (ref 6–20)
CO2: 25 mmol/L (ref 22–32)
Calcium: 9.7 mg/dL (ref 8.9–10.3)
Chloride: 104 mmol/L (ref 98–111)
Creatinine, Ser: 0.98 mg/dL (ref 0.61–1.24)
GFR, Estimated: >60 mL/min (ref >60)
Glucose, Bld: 98 mg/dL (ref 70–99)
Potassium: 4.3 mmol/L (ref 3.5–5.1)
Sodium: 139 mmol/L (ref 135–145)
Total Bilirubin: 0.4 mg/dL (ref 0.0–1.2)
Total Protein: 7.2 g/dL (ref 6.5–8.1)

## 2024-08-18 LAB — URINALYSIS, ROUTINE W REFLEX MICROSCOPIC
Bilirubin Urine: NEGATIVE
Glucose, UA: NEGATIVE mg/dL
Hgb urine dipstick: NEGATIVE
Ketones, ur: NEGATIVE mg/dL
Leukocytes,Ua: NEGATIVE
Nitrite: NEGATIVE
Protein, ur: NEGATIVE mg/dL
Specific Gravity, Urine: 1.02 (ref 1.005–1.030)
pH: 5 (ref 5.0–8.0)

## 2024-08-18 LAB — LIPASE, BLOOD: Lipase: 41 U/L (ref 11–51)

## 2024-08-18 MED ORDER — DICYCLOMINE HCL 10 MG PO CAPS: 10.0000 mg | ORAL_CAPSULE | Freq: Three times a day (TID) | ORAL | 0 refills | Status: AC

## 2024-08-18 NOTE — ED Provider Notes (Signed)
 [facility] Provider Note    Event Date/Time   First MD Initiated Contact with Patient 08/18/24 [phone]     (approximate)   History   Abdominal Pain and Otalgia  HPI  Micheal Maxwell is a 53 y.o. male with history of CHF, DVT on blood thinners who presents with left lower quadrant abdominal pain worsening over the last 24 hours.  No history of similar pain.  No history of diverticulitis.  Review of records demonstrate he had an colonoscopy in October which was unremarkable.  No history of kidney stones, no dysuria     Physical Exam   Triage Vital Signs: ED Triage Vitals  Encounter Vitals Group     BP 08/18/24 0825 125/89     Girls Systolic BP Percentile --      Girls Diastolic BP Percentile --      Boys Systolic BP Percentile --      Boys Diastolic BP Percentile --      Pulse Rate 08/18/24 0825 77     Resp 08/18/24 0825 18     Temp 08/18/24 0810 97.9 F (36.6 C)     Temp Source 08/18/24 0810 Oral     SpO2 08/18/24 0825 98 %     Weight 08/18/24 0812 81.6 kg (180 lb)     Height 08/18/24 0812 1.676 m (5' 6)     Head Circumference --      Peak Flow --      Pain Score 08/18/24 0812 7     Pain Loc --      Pain Education --      Exclude from Growth Chart --     Most recent vital signs: Vitals:   08/18/24 0825 08/18/24 1214  BP: 125/89 120/80  Pulse: 77 70  Resp: 18 18  Temp:  98 F (36.7 C)  SpO2: 98% 98%     General: Awake, no distress.  CV:  Good peripheral perfusion.  Resp:  Normal effort.  Abd:  No distention.  Tenderness in the left lower quadrant, no CVA tenderness Other:     ED Results / Procedures / Treatments   Labs (all labs ordered are listed, but only abnormal results are displayed) Labs Reviewed  CBC - Abnormal; Notable for the following components:      Result Value   WBC 3.7 (*)    MCV 79.6 (*)    RDW 16.6 (*)    All other components within normal limits  URINALYSIS, ROUTINE W REFLEX MICROSCOPIC - Abnormal;  Notable for the following components:   Color, Urine YELLOW (*)    APPearance CLEAR (*)    All other components within normal limits  LIPASE, BLOOD  COMPREHENSIVE METABOLIC PANEL WITH GFR     EKG     RADIOLOGY CT renal stone study without acute abnormality    PROCEDURES:  Critical Care performed:   Procedures   MEDICATIONS ORDERED IN ED: Medications - No data to display   IMPRESSION / MDM / ASSESSMENT AND PLAN / ED COURSE  I reviewed the triage vital signs and the nursing notes. Patient's presentation is most consistent with acute presentation with potential threat to life or bodily function.  Patient presents with left lower quadrant abdominal pain, differential includes diverticulitis, diverticular abscess, ureterolithiasis, doubt UTI  Labs pending, will send for CT to evaluate further  White blood cell count is 3.7, CMP is unremarkable.  CT scan without acute abnormality, no indication for admission at this time,  will treat with Bentyl, laxatives, urinalysis is unremarkable.  Follow-up with PCP, return precautions of worsening      FINAL CLINICAL IMPRESSION(S) / ED DIAGNOSES   Final diagnoses:  Left lower quadrant abdominal pain     Rx / DC Orders   ED Discharge Orders          Ordered    dicyclomine (BENTYL) 10 MG capsule  3 times daily before meals & bedtime        08/18/24 1210             Note:  This document was prepared using Dragon voice recognition software and may include unintentional dictation errors.   Arlander Charleston, MD 08/18/24 [phone]

## 2024-08-18 NOTE — ED Triage Notes (Signed)
 Pt c/o LLQ pain for a few days. Pt thought he was constipated and took miralax last night without relief. Pt denies any abdominal surgical hx. Pt is on eliquis  for hx of dvt. Pt also c/o R ear pain x1 week that is sharp and worse when he opens his mouth.

## 2024-08-18 NOTE — ED Notes (Signed)
 See triage note.  Presents with some abd pain  States this started a few days ago Took Miralax for constipation  But states is did not makle a difference  Denies any fever  or n/v
